# Patient Record
Sex: Female | Born: 1965 | Race: White | Hispanic: No | Marital: Married
Health system: Midwestern US, Community
[De-identification: ages and names within clinical notes are randomized; demographics above are authoritative.]

## PROBLEM LIST (undated history)

## (undated) DIAGNOSIS — Z0000 Encounter for general adult medical examination without abnormal findings: Secondary | ICD-10-CM

## (undated) DIAGNOSIS — R350 Frequency of micturition: Secondary | ICD-10-CM

## (undated) DIAGNOSIS — Z1231 Encounter for screening mammogram for malignant neoplasm of breast: Secondary | ICD-10-CM

## (undated) DIAGNOSIS — Z20822 Contact with and (suspected) exposure to covid-19: Secondary | ICD-10-CM

## (undated) DIAGNOSIS — Z1239 Encounter for other screening for malignant neoplasm of breast: Secondary | ICD-10-CM

## (undated) DIAGNOSIS — M1990 Unspecified osteoarthritis, unspecified site: Secondary | ICD-10-CM

## (undated) DIAGNOSIS — N390 Urinary tract infection, site not specified: Secondary | ICD-10-CM

## (undated) DIAGNOSIS — E785 Hyperlipidemia, unspecified: Secondary | ICD-10-CM

## (undated) DIAGNOSIS — R319 Hematuria, unspecified: Secondary | ICD-10-CM

## (undated) DIAGNOSIS — E7800 Pure hypercholesterolemia, unspecified: Secondary | ICD-10-CM

## (undated) DIAGNOSIS — E559 Vitamin D deficiency, unspecified: Secondary | ICD-10-CM

## (undated) DIAGNOSIS — E538 Deficiency of other specified B group vitamins: Secondary | ICD-10-CM

---

## 2013-02-24 NOTE — Patient Instructions (Signed)
Headache: After Your Visit  Your Care Instructions     Headaches have many possible causes. Most headaches aren't a sign of a more serious problem, and they will get better on their own. Home treatment may help you feel better faster.  The doctor has checked you carefully, but problems can develop later. If you notice any problems or new symptoms, get medical treatment right away.  Follow-up care is a key part of your treatment and safety. Be sure to make and go to all appointments, and call your doctor if you are having problems. It's also a good idea to know your test results and keep a list of the medicines you take.  How can you care for yourself at home?  ?? Do not drive if you have taken a prescription pain medicine.  ?? Rest in a quiet, dark room until your headache is gone. Close your eyes and try to relax or go to sleep. Don't watch TV or read.  ?? Put a cold, moist cloth or cold pack on the painful area for 10 to 20 minutes at a time. Put a thin cloth between the cold pack and your skin.  ?? Use a warm, moist towel or a heating pad set on low to relax tight shoulder and neck muscles.  ?? Have someone gently massage your neck and shoulders.  ?? Take pain medicines exactly as directed.  ?? If the doctor gave you a prescription medicine for pain, take it as prescribed.  ?? If you are not taking a prescription pain medicine, ask your doctor if you can take an over-the-counter medicine.  ?? Be careful not to take pain medicine more often than the instructions allow, because you may get worse or more frequent headaches when the medicine wears off.  ?? Do not ignore new symptoms that occur with a headache, such as a fever, weakness or numbness, vision changes, or confusion. These may be signs of a more serious problem.  To prevent headaches  ?? Keep a headache diary so you can figure out what triggers your headaches. Avoiding triggers may help you prevent headaches. Record when each headache began, how long it lasted, and  what the pain was like (throbbing, aching, stabbing, or dull). Write down any other symptoms you had with the headache, such as nausea, flashing lights or dark spots, or sensitivity to bright light or loud noise. Note if the headache occurred near your period. List anything that might have triggered the headache, such as certain foods (chocolate, cheese, wine) or odors, smoke, bright light, stress, or lack of sleep.  ?? Find healthy ways to deal with stress. Headaches are most common during or right after stressful times. Take time to relax before and after you do something that has caused a headache in the past.  ?? Try to keep your muscles relaxed by keeping good posture. Check your jaw, face, neck, and shoulder muscles for tension, and try relaxing them. When sitting at a desk, change positions often, and stretch for 30 seconds each hour.  ?? Get plenty of sleep and exercise.  ?? Eat regularly and well. Long periods without food can trigger a headache.  ?? Treat yourself to a massage. Some people find that regular massages are very helpful in relieving tension.  ?? Limit caffeine by not drinking too much coffee, tea, or soda. But don't quit caffeine suddenly, because that can also give you headaches.  ?? Reduce eyestrain from computers by blinking frequently and looking away   from the computer screen every so often. Make sure you have proper eyewear and that your monitor is set up properly, about an arm's length away.  ?? Seek help if you have depression or anxiety. Your headaches may be linked to these conditions. Treatment can both prevent headaches and help with symptoms of anxiety or depression.  When should you call for help?  Call 911 anytime you think you may need emergency care. For example, call if:  ?? You have signs of a stroke. These may include:  ?? Sudden numbness, paralysis, or weakness in your face, arm, or leg, especially on only one side of your body.  ?? Sudden vision changes.  ?? Sudden trouble speaking.   ?? Sudden confusion or trouble understanding simple statements.  ?? Sudden problems with walking or balance.  ?? A sudden, severe headache that is different from past headaches.  Call your doctor now or seek immediate medical care if:  ?? You have a new or worse headache.  ?? Your headache gets much worse.   Where can you learn more?   Go to http://www.healthwise.net/[facility]  Enter M271 in the search box to learn more about "Headache: After Your Visit."   ?? 2006-2014 Healthwise, Incorporated. Care instructions adapted under license by [employer] (which disclaims liability or warranty for this information). This care instruction is for use with your licensed healthcare professional. If you have questions about a medical condition or this instruction, always ask your healthcare professional. Healthwise, Incorporated disclaims any warranty or liability for your use of this information.  Content Version: 10.2.346038; Current as of: June 23, 2012

## 2013-02-24 NOTE — Progress Notes (Signed)
Internal Medicine Progress Note    NAME:  Lindsay Liu   DOB:   08/05/1965   MRN:   [id number]   AGE:             47 y.o.  SEX:   female    Date/Time:  02/24/2013  Subjective:     Chief Complaint: 47 y.o. female  who comes for  Migraine        HPI:  Migraine   The history is provided by the patient. This is a new (left occipital headache) problem. Episode onset: 4 days ago Sunday night. The problem occurs constantly. The problem has not changed since onset.The headache is aggravated by nothing. The pain is located in the occipital region. The quality of the pain is described as throbbing. The pain is at a severity of 8/10. The pain is severe. Associated symptoms include malaise/fatigue (wakes her up in the middle of the night) and dizziness (feeling lightheaded). Pertinent negatives include no fever, no chest pressure, no near-syncope, no orthopnea, no palpitations, no syncope, no shortness of breath, no weakness, no tingling, no visual change, no nausea and no vomiting. Treatments tried: tried Aleve and Advil. Advil works better. The treatment provided moderate relief.     Advil brings down her headache from 7-8/10 to 2-3/10.     Patient stated that she's feeling stress (mom has cancer) - on Sunday they were shopping around for a wig for her mom and her headache started Sunday night. Patient denies previous history of headaches.    Other concerns:    1- A very itchy rash appeared on right side of her face anterior to right ear. Denies pain. However, she c/o shooting pain on the right occipital. This is different from the left occipital headache that wakes her up in the middle of the night. She's concerned about shingles and being around her mom.  2- Needs requisition for bone density and screening mammography  3- flu vaccine      Review of Systems:  Review of Systems   Constitutional: Positive for malaise/fatigue (wakes her up in the middle of the night). Negative for fever.   HENT: Positive for congestion (nasal).  Negative for ear pain and sore throat.    Eyes: Negative.    Respiratory: Negative.  Negative for shortness of breath.    Cardiovascular: Negative.  Negative for palpitations, orthopnea, syncope and near-syncope.   Gastrointestinal: Negative.  Negative for nausea and vomiting.   Genitourinary: Negative.    Musculoskeletal: Negative.    Skin: Positive for itching and rash (right side of face).   Neurological: Positive for dizziness (feeling lightheaded) and headaches. Negative for tingling, sensory change, speech change, focal weakness and weakness.   Psychiatric/Behavioral:        Mom has myeloma and patient feeling stress           with a past history as below    PMH:  Past Medical History   Diagnosis Date   ??? Hypercholesterolemia    ??? Thyroid disease    ??? Chronic urticaria    ??? Endometriosis        PSH:  Past Surgical History   Procedure Laterality Date   ??? Hx appendectomy     ??? Hx laparotomy     ??? Hx cholecystectomy             FAMILY HX:  Family History   Problem Relation Age of Onset   ??? Elevated Lipids Mother    ???  Heart Disease Mother    ??? Heart Disease Father    ??? Cancer Mother 57     multiple myeloma       SOCIAL HX:  History     Social History   ??? Marital Status: MARRIED     Spouse Name: N/A     Number of Children: N/A   ??? Years of Education: N/A     Social History Main Topics   ??? Smoking status: Former Smoker -- 0.25 packs/day for 20 years     Types: Cigarettes     Quit date: 07/15/2005   ??? Smokeless tobacco: Not on file   ??? Alcohol Use: Yes      Comment: social   ??? Drug Use: No   ??? Sexually Active: Not on file     Other Topics Concern   ??? Not on file     Social History Narrative   ??? No narrative on file         IMMUNIZATIONS:    There is no immunization history on file for this patient.     [x] Current Medication list and allergies reviewed:  Allergies   Allergen Reactions   ??? Levaquin [Levofloxacin] Shortness of Breath   ??? Penicillin G Shortness of Breath       Medications currently taking:    No current  facility-administered medications for this visit.         Objective:   Vitals:  Filed Vitals:    02/24/13 0938   BP: 120/90   Pulse: 85   Temp: 98.1 ??F (36.7 ??C)   TempSrc: Oral   Resp: 16   Height: 5\' 3"  (1.6 m)   Weight: 156 lb (70.761 kg)   SpO2: 96%     Body mass index is 27.64 kg/(m^2).      PHYSICAL EXAM:  Physical Exam   Constitutional: She is oriented to person, place, and time. She appears well-developed and well-nourished. No distress.   HENT:   Head: Normocephalic.       Right Ear: Hearing, tympanic membrane, external ear and ear canal normal.   Left Ear: Hearing, tympanic membrane, external ear and ear canal normal.   Mouth/Throat: Uvula is midline, oropharynx is clear and moist and mucous membranes are normal.   Neck: Normal range of motion.   Cardiovascular: Normal rate, regular rhythm, normal heart sounds and intact distal pulses.    Pulmonary/Chest: Effort normal and breath sounds normal.   Abdominal: Soft. There is no tenderness.   Neurological: She is alert and oriented to person, place, and time. She has normal strength. No cranial nerve deficit or sensory deficit.   Skin: Skin is warm and dry.               Assessment/Plan:     Sanae was seen today for migraine.    Impression and associated orders for this visit:    Headache  - Trial of meloxicam (MOBIC) 15 mg tablet; Take 1 tablet by mouth daily  -  Go to ED if headache worsens and becomes "worst headache of her life" and/or new symptoms develop.    Rash of face  - Start valACYclovir (VALTREX) 1 gram tablet; Take 1 tablet by mouth three (3) times daily for 7 days.    Return to office if symptoms worsening or new symptoms develop    Other Orders  Breast cancer screening  - MAM MAMMOGRAM SCREENING BILATERAL; Future    Screening for osteoporosis  - DEXA BONE DENSITY  STUDY AXIAL; Future     Flu vaccine given.      This case was presented to Dr. Nile Riggs, MD.  .      No results found for any visits on 02/24/13.  Follow-up Disposition: Not on File     Total time spent with patient:  15   minutes    Care Plan discussed with:    [x] Patient   [] Family    [] Care Manager    [] Nursing   [] Consultant/Specialist :      []   >50% of visit spent in counseling and coordination of care   (Discussed [] CODE status,  [] Care Plan, [] D/C Planning)      I have reviewed patient's allergies, past med/surgical/family hx, smoking status, problem lists, and have completed medication reconciliation for today's visit.       Eryka Dolinger Reyes-Pastorella, NP      02/24/2013  ___________________________________________________

## 2013-02-24 NOTE — Progress Notes (Signed)
I reviewed the patient's medical history, the nurse practitioner's findings on physical examination, the patient's diagnoses, and treatment plan as documented in the progress note. I concur with the treatment plan as documented. There are no additional recommendations at this time.    Temitayo Covalt, MD

## 2013-05-06 ENCOUNTER — Encounter

## 2013-05-06 NOTE — Progress Notes (Signed)
Quick Note:    Team 3- This is the first mammogram result. Pls tell patient she needs mammo every year. Thx.  ______

## 2013-05-06 NOTE — Progress Notes (Signed)
Quick Note:    Team 3 - Pls call patient to let her know that the additional mammogram of her left breast is normal but she needs to do it every year.  ______

## 2013-05-06 NOTE — Progress Notes (Signed)
Quick Note:    Lm to c/b  ______

## 2013-05-31 NOTE — Progress Notes (Signed)
Quick Note:    L/m to c.b  ______

## 2013-05-31 NOTE — Progress Notes (Signed)
Quick Note:    Pt advised  ______

## 2013-12-30 LAB — AMB POC URINE DIP STICK MANUAL W/O MICRO CHEMSTRIP-10
Bilirubin (UA POC): NEGATIVE
Glucose (UA POC): NEGATIVE mg/dL
Ketones (UA POC): NEGATIVE
Nitrites (UA POC): POSITIVE
Specific gravity (UA POC): 1.015 (ref 1–1.03)
Urobilinogen (UA POC): NORMAL
pH (UA POC): 5 (ref 5–9)

## 2013-12-30 MED ORDER — TRIMETHOPRIM-SULFAMETHOXAZOLE 160 MG-800 MG TAB
160-800 mg | ORAL_TABLET | Freq: Two times a day (BID) | ORAL | Status: DC
Start: 2013-12-30 — End: 2014-01-04

## 2013-12-30 MED ORDER — PHENAZOPYRIDINE 200 MG TAB
200 mg | ORAL_TABLET | Freq: Three times a day (TID) | ORAL | Status: AC | PRN
Start: 2013-12-30 — End: 2014-01-02

## 2013-12-30 NOTE — Patient Instructions (Signed)
Urinary Tract Infection in Women: After Your Visit  Your Care Instructions     A urinary tract infection, or UTI, is a general term for an infection anywhere between the kidneys and the urethra (where urine comes out). Most UTIs are bladder infections. They often cause pain or burning when you urinate.  UTIs are caused by bacteria and can be cured with antibiotics. Be sure to complete your treatment so that the infection goes away.  Follow-up care is a key part of your treatment and safety. Be sure to make and go to all appointments, and call your doctor if you are having problems. It's also a good idea to know your test results and keep a list of the medicines you take.  How can you care for yourself at home?  ?? Take your antibiotics as directed. Do not stop taking them just because you feel better. You need to take the full course of antibiotics.  ?? Drink extra water and other fluids for the next day or two. This may help wash out the bacteria that are causing the infection. (If you have kidney, heart, or liver disease and have to limit fluids, talk with your doctor before you increase your fluid intake.)  ?? Avoid drinks that are carbonated or have caffeine. They can irritate the bladder.  ?? Urinate often. Try to empty your bladder each time.  ?? To relieve pain, take a hot bath or lay a heating pad set on low over your lower belly or genital area. Never go to sleep with a heating pad in place.  To prevent UTIs  ?? Drink plenty of water each day. This helps you urinate often, which clears bacteria from your system. (If you have kidney, heart, or liver disease and have to limit fluids, talk with your doctor before you increase your fluid intake.)  ?? Consider adding cranberry juice to your diet.  ?? Urinate when you need to.  ?? Urinate right after you have sex.  ?? Change sanitary pads often.  ?? Avoid douches, bubble baths, feminine hygiene sprays, and other feminine hygiene products that have deodorants.   ?? After going to the bathroom, wipe from front to back.  When should you call for help?  Call your doctor now or seek immediate medical care if:  ?? Symptoms such as fever, chills, nausea, or vomiting get worse or appear for the first time.  ?? You have new pain in your back just below your rib cage. This is called flank pain.  ?? There is new blood or pus in your urine.  ?? You have any problems with your antibiotic medicine.  Watch closely for changes in your health, and be sure to contact your doctor if:  ?? You are not getting better after taking an antibiotic for 2 days.  ?? Your symptoms go away but then come back.   Where can you learn more?   Go to http://www.healthwise.net/[facility]  Enter K848 in the search box to learn more about "Urinary Tract Infection in Women: After Your Visit."   ?? 2006-2015 Healthwise, Incorporated. Care instructions adapted under license by [employer] (which disclaims liability or warranty for this information). This care instruction is for use with your licensed healthcare professional. If you have questions about a medical condition or this instruction, always ask your healthcare professional. Healthwise, Incorporated disclaims any warranty or liability for your use of this information.  Content Version: 10.5.422740; Current as of: December 21, 2012

## 2013-12-30 NOTE — Progress Notes (Signed)
Chief Complaint:   Urinary Frequency and Suprapubic Pressure      HPI:   Lindsay Liu is a 48 y.o. female with a past medical history as below who comes for c/o urinary pressure, frequency and urgency past few days, + b/l flank discomfort, cranberry pills without effect, denies fever, chills   denies abdominal/ pelvic pain or unusual vaginal discharge/ bleeding. Patient does not have a history of recurrent UTI or pyelonephritis.      Review of Systems : Positive as above      Allergies   Allergen Reactions   ??? Levaquin [Levofloxacin] Shortness of Breath   ??? Penicillin G Shortness of Breath       Past Medical History   Diagnosis Date   ??? Hypercholesterolemia    ??? Thyroid disease    ??? Chronic urticaria    ??? Endometriosis      Past Surgical History   Procedure Laterality Date   ??? Hx appendectomy     ??? Hx laparotomy     ??? Hx cholecystectomy       Family History   Problem Relation Age of Onset   ??? Elevated Lipids Mother    ??? Heart Disease Mother    ??? Heart Disease Father    ??? Cancer Mother 32     multiple myeloma     Current Outpatient Prescriptions   Medication Sig Dispense Refill   ??? meloxicam (MOBIC) 15 mg tablet Take 1 tablet by mouth daily. 30 tablet 0         Pulse 93   Temp(Src) 98.7 ??F (37.1 ??C)   Ht '5\' 3"'  (1.6 m)   Wt 164 lb (74.39 kg)   BMI 29.06 kg/m2   SpO2 98%      PHYSICAL EXAM:  General appearance: WDWN , appears stated age, NAD  Eyes: sclera and conjunctiva norm  Abdomen: +BS, soft, non-tender, negative CVA tenderness  Extremities: no edema  Skin: warm and dry  MS:  equal strength bilaterally  GU: not examined  Neurologic: alert and oriented, good insight    Results for orders placed or performed in visit on 12/30/13   AMB POC URINE DIP STICK MANUAL W/O MICRO CHEMSTRIP-10   Result Value Ref Range    Color (UA POC) Yellow     Clarity (UA POC) Clear     Specific gravity (UA POC) 1.015 1 - 1.03    pH (UA POC) 5.0 5 - 9    Leukocyte esterase (UA POC) 3+ Negative    Nitrites (UA POC) Positive Negative     Protein (UA POC) Trace Negative mg/dL    Glucose (UA POC) Negative Negative mg/dL    Ketones (UA POC) Negative Negative    Urobilinogen (UA POC) normal Neg    Bilirubin (UA POC) Negative Negative    Blood (UA POC) about 50 Negative         Assessment/Plan:    1. Urinary frequency    - AMB POC URINE DIP STICK MANUAL W/O MICRO CHEMSTRIP-10  - CULTURE, URINE  - URINALYSIS W/MICROSCOPIC    2. UTI (lower urinary tract infection)  Increase fluids  F/u call Monday review C&S results  - trimethoprim-sulfamethoxazole (BACTRIM DS, SEPTRA DS) 160-800 mg per tablet; Take 1 Tab by mouth two (2) times a day.  Dispense: 10 Tab; Refill: 0  - phenazopyridine (PYRIDIUM) 200 mg tablet; Take 1 Tab by mouth three (3) times daily as needed for Pain for up to 3 days.  Dispense: 6  Tab; Refill: 0          Care Plan discussed with:   Patient   [place]   Consultant/Specialist :     >50% of visit spent in counseling and coordination of care Discussed Advanced     Directives,   smoking cessation    diet   excercise   weight loss recommended  seat belts  sun screen usage  safe sex practices        Felipa Emory, NP

## 2013-12-31 LAB — URINALYSIS W/MICROSCOPIC
Bilirubin: NEGATIVE
Crystals, urine: NONE SEEN /HPF
Epithelial Cells: NONE SEEN /HPF
Glucose: NEGATIVE mg/dL
Ketone: NEGATIVE mg/dL
Nitrites: NEGATIVE
Protein: NEGATIVE mg/dL
Specific gravity: 1.019 R.I. (ref 1.005–1.030)
Urobilinogen: 0.2 mg/dL (ref 0.0–1.0)
Yeast, urine: NONE SEEN /HPF
pH (UA): 5 (ref 5.0–8.0)

## 2014-01-02 LAB — ANTIMICROBIAL SUSCEPTIBILITY

## 2014-01-02 LAB — CULTURE, URINE

## 2014-01-02 NOTE — Telephone Encounter (Signed)
Results reviewed, sx have improved, feeling better

## 2014-01-02 NOTE — Progress Notes (Signed)
I reviewed the patient's medical history, the nurse practitioner's findings on physical examination, the patient's diagnoses, and treatment plan as documented in the progress note. I concur with the treatment plan as documented. There are no additional recommendations at this time.    Sommer Spickard, MD

## 2014-01-04 ENCOUNTER — Encounter

## 2014-01-04 MED ORDER — TRIMETHOPRIM-SULFAMETHOXAZOLE 160 MG-800 MG TAB
160-800 mg | ORAL_TABLET | Freq: Two times a day (BID) | ORAL | Status: DC
Start: 2014-01-04 — End: 2014-08-12

## 2014-01-04 NOTE — Telephone Encounter (Signed)
Patient evaluated Friday (dx) UTI  Prescribed  bactrim for 5-days - pt states she completed the course of medication - but is still feeling pressure / pressure feeling only little better  [phone]

## 2014-01-05 NOTE — Telephone Encounter (Signed)
Called advised patient  Lindsay Liu

## 2014-04-14 LAB — HM DEXA SCAN

## 2014-06-02 NOTE — Progress Notes (Signed)
Quick Note:        Bone density is normal 10. & Stable    ______

## 2014-06-05 NOTE — Progress Notes (Signed)
Quick Note:        lvm on cell # for pt to call me back with results        ______

## 2014-06-05 NOTE — Progress Notes (Signed)
Quick Note:        S/w pt reviewed normal Dexa and Normal Mammo results. Instructed to f/u Dexa in 2 yrs and F/u Mammo in 1 yr.    Discussed with pt regular calcium & vitamin d intake, calcium rich foods, and weight bearing exercises like swimming, walking, dancing.    ______

## 2014-08-12 ENCOUNTER — Ambulatory Visit
Admit: 2014-08-12 | Discharge: 2014-08-12 | Payer: PRIVATE HEALTH INSURANCE | Attending: Acute Care | Primary: Internal Medicine

## 2014-08-12 DIAGNOSIS — N3090 Cystitis, unspecified without hematuria: Secondary | ICD-10-CM

## 2014-08-12 LAB — AMB POC URINE DIP STICK MANUAL W/O MICRO CHEMSTRIP-10
Bilirubin (UA POC): NEGATIVE
Glucose (UA POC): NEGATIVE mg/dL
Ketones (UA POC): NEGATIVE
Leukocyte esterase (UA POC): NEGATIVE
Nitrites (UA POC): NEGATIVE
Protein (UA POC): NEGATIVE mg/dL
Specific gravity (UA POC): 1.015 (ref 1–1.03)
Urobilinogen (UA POC): NORMAL
pH (UA POC): 7 (ref 5–9)

## 2014-08-12 MED ORDER — NITROFURANTOIN (25% MACROCRYSTAL FORM) 100 MG CAP
100 mg | ORAL_CAPSULE | Freq: Two times a day (BID) | ORAL | Status: DC
Start: 2014-08-12 — End: 2014-09-15

## 2014-08-12 NOTE — Progress Notes (Signed)
Chief Complaint:  Lindsay Liu is a 49 y.o.female here for Bladder Infection      HPI:  Supra pubic pressure started Thursday, mild dysuria, denies vag d/c, no hematuria, +frequency, denies flank/back pain - new to these sx    Review of Systems :         Allergies   Allergen Reactions   ??? Levaquin [Levofloxacin] Shortness of Breath     Cipro in the past without adverse effect   ??? Penicillin G Shortness of Breath       Outpatient Prescriptions Marked as Taking for the 08/12/14 encounter (Office Visit) with Hoy Register, NP   Medication Sig Dispense Refill   ??? meloxicam (MOBIC) 15 mg tablet Take 15 mg by mouth daily.     ??? nitrofurantoin, macrocrystal-monohydrate, (MACROBID) 100 mg capsule Take 1 Cap by mouth two (2) times a day. Indications: BACTERIAL URINARY TRACT INFECTION 14 Cap 7       Past Medical History   Diagnosis Date   ??? Hypercholesterolemia    ??? Thyroid disease    ??? Chronic urticaria    ??? Endometriosis      Past Surgical History   Procedure Laterality Date   ??? Hx appendectomy     ??? Hx laparotomy     ??? Hx cholecystectomy       Family History   Problem Relation Age of Onset   ??? Elevated Lipids Mother    ??? Heart Disease Mother    ??? Heart Disease Father    ??? Cancer Mother 83     multiple myeloma     Family Status   Relation Status Death Age   ??? Mother Alive    ??? Father Deceased 38     MI   ??? Daughter Alive    ??? Son Alive      History     Social History   ??? Marital Status: MARRIED     Spouse Name: N/A   ??? Number of Children: N/A   ??? Years of Education: N/A     Occupational History   ??? Not on file.     Social History Main Topics   ??? Smoking status: Former Smoker -- 0.25 packs/day for 20 years     Types: Cigarettes     Quit date: 07/15/2005   ??? Smokeless tobacco: Not on file   ??? Alcohol Use: Yes      Comment: social   ??? Drug Use: No   ??? Sexual Activity: Not on file     Other Topics Concern   ??? Not on file     Social History Narrative     Immunization History   Administered Date(s) Administered    ??? Influenza Vaccine PF 02/24/2013         BP 105/77 mmHg   Pulse 84   Temp(Src) 98.4 ??F (36.9 ??C) (Oral)   Resp 16   Ht _0  (1.6 m)   Wt 168 lb (76.204 kg)   BMI 29.77 kg/m2   SpO2 98%    Physical Exam:  General appearance: WDWN , appears stated age, NAD  Skin: warm, dry, no rash  Cardiovascular: RRR, no murmur  Respiratory: clear to auscultation bilaterally     Abdomen: +BS, no bruits, soft, non-tender, no CVAT  Extremities: no edema  Neurologic: alert and oriented, cranial nerves grossly intact, good recall    Results for orders placed or performed in visit on 08/12/14   AMB POC URINE DIP STICK  MANUAL W/O MICRO CHEMSTRIP-10   Result Value Ref Range    Color (UA POC) Light Yellow     Clarity (UA POC) Clear     Specific gravity (UA POC) 1.015 1 - 1.03    pH (UA POC) 7 5 - 9    Leukocyte esterase (UA POC) Negative Negative    Nitrites (UA POC) Negative Negative    Protein (UA POC) Negative Negative mg/dL    Glucose (UA POC) Negative Negative mg/dL    Ketones (UA POC) Negative Negative    Urobilinogen (UA POC) normal Neg    Bilirubin (UA POC) Negative Negative    Blood (UA POC) Trace Negative     Office Visit on 08/12/2014   Component Date Value   ??? Color (UA POC) 08/12/2014 Light Yellow    ??? Clarity (UA POC) 08/12/2014 Clear    ??? Specific gravity (UA POC) 08/12/2014 1.015    ??? pH (UA POC) 08/12/2014 7    ??? Leukocyte esterase (UA P* 08/12/2014 Negative    ??? Nitrites (UA POC) 08/12/2014 Negative    ??? Protein (UA POC) 08/12/2014 Negative    ??? Glucose (UA POC) 08/12/2014 Negative    ??? Ketones (UA POC) 08/12/2014 Negative    ??? Urobilinogen (UA POC) 08/12/2014 normal    ??? Bilirubin (UA POC) 08/12/2014 Negative    ??? Blood (UA POC) 08/12/2014 Trace        Assessment/Plan:  Lindsay Liu was seen today for bladder infection.    Diagnoses and all orders for this visit:    Bladder infection  Orders:  -     AMB POC URINE DIP STICK MANUAL W/O MICRO CHEMSTRIP-10    Other orders   -     nitrofurantoin, macrocrystal-monohydrate, (MACROBID) 100 mg capsule; Take 1 Cap by mouth two (2) times a day. Indications: BACTERIAL URINARY TRACT INFECTION          Follow up:   Follow-up Disposition:  Return in about 1 month (around 09/11/2014) for CPE.    Care Plan discussed with:   Patient   Family    >50% of visit spent in counseling and coordination of care    Discussed Advanced Directives,   smoking cessation    diet   exercise      weight loss recommended  seat belts  medication compliance/usage/side effect  time spent 25 mins    I have reviewed patient's allergies, past med/surgical/family hx, smoking status, problem lists, and have completed medication reconciliation for today's visit.    Hoy Register, NP  08/12/2014

## 2014-08-12 NOTE — Progress Notes (Signed)
I reviewed the patient's medical history, the nurse practitioner's findings on physical examination, the patient's diagnoses, and treatment plan as documented in the progress note. I concur with the treatment plan as documented. There are no additional recommendations at this time.    Christpoher Sievers A Keanan Melander, MD

## 2014-08-12 NOTE — Patient Instructions (Signed)
Urinary Tract Infection in Women: Care Instructions  Your Care Instructions     A urinary tract infection, or UTI, is a general term for an infection anywhere between the kidneys and the urethra (where urine comes out). Most UTIs are bladder infections. They often cause pain or burning when you urinate.  UTIs are caused by bacteria and can be cured with antibiotics. Be sure to complete your treatment so that the infection goes away.  Follow-up care is a key part of your treatment and safety. Be sure to make and go to all appointments, and call your doctor if you are having problems. It's also a good idea to know your test results and keep a list of the medicines you take.  How can you care for yourself at home?  ?? Take your antibiotics as directed. Do not stop taking them just because you feel better. You need to take the full course of antibiotics.  ?? Drink extra water and other fluids for the next day or two. This may help wash out the bacteria that are causing the infection. (If you have kidney, heart, or liver disease and have to limit fluids, talk with your doctor before you increase your fluid intake.)  ?? Avoid drinks that are carbonated or have caffeine. They can irritate the bladder.  ?? Urinate often. Try to empty your bladder each time.  ?? To relieve pain, take a hot bath or lay a heating pad set on low over your lower belly or genital area. Never go to sleep with a heating pad in place.  To prevent UTIs  ?? Drink plenty of water each day. This helps you urinate often, which clears bacteria from your system. (If you have kidney, heart, or liver disease and have to limit fluids, talk with your doctor before you increase your fluid intake.)  ?? Consider adding cranberry juice to your diet.  ?? Urinate when you need to.  ?? Urinate right after you have sex.  ?? Change sanitary pads often.  ?? Avoid douches, bubble baths, feminine hygiene sprays, and other feminine hygiene products that have deodorants.   ?? After going to the bathroom, wipe from front to back.  When should you call for help?  Call your doctor now or seek immediate medical care if:  ?? Symptoms such as fever, chills, nausea, or vomiting get worse or appear for the first time.  ?? You have new pain in your back just below your rib cage. This is called flank pain.  ?? There is new blood or pus in your urine.  ?? You have any problems with your antibiotic medicine.  Watch closely for changes in your health, and be sure to contact your doctor if:  ?? You are not getting better after taking an antibiotic for 2 days.  ?? Your symptoms go away but then come back.   Where can you learn more?   Go to http://www.healthwise.net/[facility]  Enter K848 in the search box to learn more about "Urinary Tract Infection in Women: Care Instructions."   ?? 2006-2016 [employer]. Care instructions adapted under license by [facility] (which disclaims liability or warranty for this information). This care instruction is for use with your licensed healthcare professional. If you have questions about a medical condition or this instruction, always ask your healthcare professional. Healthwise, Incorporated disclaims any warranty or liability for your use of this information.  Content Version: 10.8.513193; Current as of: March 03, 2014

## 2014-08-14 LAB — URINALYSIS W/MICROSCOPIC
Bilirubin: NEGATIVE
Crystals, urine: NONE SEEN /HPF
Epithelial Cells: NONE SEEN /HPF
Glucose: NEGATIVE mg/dL
Ketone: NEGATIVE mg/dL
Leukocyte Esterase: NEGATIVE
Nitrites: POSITIVE — ABNORMAL HIGH
Protein: NEGATIVE mg/dL
Specific gravity: 1.004 R.I. — ABNORMAL LOW (ref 1.005–1.030)
Urobilinogen: 0.2 mg/dL (ref 0.0–1.0)
WBC: NONE SEEN /HPF (ref 5–5)
Yeast, urine: NONE SEEN /HPF
pH (UA): 6.5 (ref 5.0–8.0)

## 2014-08-16 LAB — ANTIMICROBIAL SUSCEPTIBILITY

## 2014-08-16 LAB — CULTURE, URINE

## 2014-08-17 NOTE — Progress Notes (Signed)
Quick Note:        + UTI - antibiotic (macrobid) good coverage - advise pt to complete    ______

## 2014-08-18 ENCOUNTER — Encounter

## 2014-08-18 NOTE — Progress Notes (Signed)
Quick Note:        LMOM asking pt to call our office back for lab results    ______

## 2014-08-21 NOTE — Progress Notes (Signed)
Quick Note:        S/w pt review good coverage onmacrobid; voiding on urge and regular water intake    ______

## 2014-09-07 ENCOUNTER — Telehealth

## 2014-09-07 MED ORDER — ERGOCALCIFEROL (VITAMIN D2) 50,000 UNIT CAP
1250 mcg (50,000 unit) | ORAL_CAPSULE | ORAL | Status: DC
Start: 2014-09-07 — End: 2015-09-20

## 2014-09-07 NOTE — Progress Notes (Signed)
Quick Note:        Discussed with pt. Supplement B12 1000 mcg daily. Supplement Vit D as per protocol for 12 weeks followed by maintenance dose.    Elevated cholesterol, will discuss on 6/3    ______

## 2014-09-07 NOTE — Telephone Encounter (Signed)
Vit D = 12  B 12 = 265    Supplement Vit D as per protocol  B12 1000 mcg daily, will get B12 inj on 6/3 when she comes in for physical

## 2014-09-15 ENCOUNTER — Ambulatory Visit
Admit: 2014-09-15 | Discharge: 2014-09-15 | Payer: PRIVATE HEALTH INSURANCE | Attending: Primary Care | Primary: Internal Medicine

## 2014-09-15 DIAGNOSIS — Z0000 Encounter for general adult medical examination without abnormal findings: Secondary | ICD-10-CM

## 2014-09-15 MED ORDER — ATORVASTATIN 10 MG TAB
10 mg | ORAL_TABLET | Freq: Every day | ORAL | Status: DC
Start: 2014-09-15 — End: 2015-09-20

## 2014-09-15 MED ORDER — CYANOCOBALAMIN 1,000 MCG/ML IJ SOLN
1000 mcg/mL | Freq: Once | INTRAMUSCULAR | Status: AC
Start: 2014-09-15 — End: 2014-09-15
  Administered 2014-09-15: 15:00:00 via INTRAMUSCULAR

## 2014-09-15 NOTE — Progress Notes (Signed)
Chief Complaint :  Lindsay Liu is a 49 y.o.female here for Complete Physical      Patient Care Team:  Domingo Mend, MD as PCP - General (Internal Medicine)  Kevan Ny, MD (Orthopedic Surgery)  Everardo Pacific, NP    HPI  :  Here for CPE, cannot lose weight. Does not exercise. Doesn't really follow healthy diet. Sees Dr. Candace Cruise for ongoing neck pain. Takes diclofenac.   H/O elevated lipids  Labs-done and reviewed with pt  Mammo-UTD  GYN-UTD  BMD-done 2015  Cscope-will schedule      Review of Systems :  General : neg for fever, chills, wt loss/gain, fatigue  HEENT: neg for blurred vision, double vision, ear pain, sore throat  Respiratory :  neg for cough, sob, doe, wheezing  Cardiovascular : neg for chest pain, palpitations, swelling  Gastrointestinal : neg for abd pain, n/v/d, change in bowel habits, blood in stool, reflux  Genito-Urinary : neg for dysuria, trouble voiding, or hematuria  MS: neg for joint / muscle pain, swelling  Neurological : neg for dizziness or headaches  Psych: neg for depression or anxiety  Skin:  neg rash, changing skin lesion      Allergies   Allergen Reactions   ??? Levaquin [Levofloxacin] Shortness of Breath     Cipro in the past without adverse effect   ??? Penicillin G Shortness of Breath     Outpatient Prescriptions Marked as Taking for the 09/15/14 encounter (Office Visit) with Annett Gula, NP   Medication Sig Dispense Refill   ??? cyanocobalamin 1,000 mcg tablet Take 1,000 mcg by mouth daily.     ??? diclofenac potassium (CATAFLAM) 50 mg tablet Take 50 mg by mouth two (2) times a day.     ??? ergocalciferol (ERGOCALCIFEROL) 50,000 unit capsule Take 1 Cap by mouth every seven (7) days. 12 Cap 3     Current Facility-Administered Medications for the 09/15/14 encounter (Office Visit) with Annett Gula, NP   Medication Dose Route Frequency Provider Last Rate Last Dose   ??? [COMPLETED] cyanocobalamin (VITAMIN B12) injection 1,000 mcg  1,000 mcg  IntraMUSCular ONCE Annett Gula, NP   1,000 mcg at 09/15/14 1100     Past Medical History   Diagnosis Date   ??? Hypercholesterolemia    ??? Thyroid disease    ??? Chronic urticaria    ??? Endometriosis    ??? Cervical spine pain      Oh     Past Surgical History   Procedure Laterality Date   ??? Hx appendectomy     ??? Hx laparotomy     ??? Hx cholecystectomy  01/1994     Family History   Problem Relation Age of Onset   ??? Elevated Lipids Mother    ??? Heart Disease Mother    ??? Heart Disease Father    ??? Cancer Mother 82     multiple myeloma   ??? Elevated Lipids Brother      Family Status   Relation Status Death Age   ??? Mother Alive    ??? Father Deceased 55     MI   ??? Daughter Alive    ??? Son Alive    ??? Sister Alive    ??? Brother Alive      History     Social History   ??? Marital Status: MARRIED     Spouse Name: N/A   ??? Number of Children: 2   ??? Years of Education: N/A  Occupational History   ???  Self Employed     Social History Main Topics   ??? Smoking status: Current Every Day Smoker -- 30 years     Types: Cigarettes   ??? Smokeless tobacco: Never Used      Comment: 5-7 cigarettes per day.   ??? Alcohol Use: 0.0 oz/week     0 Standard drinks or equivalent per week      Comment: social   ??? Drug Use: No   ??? Sexual Activity: Not on file     Other Topics Concern   ??? Not on file     Social History Narrative     Immunization History   Administered Date(s) Administered   ??? Influenza Vaccine PF 02/24/2013     LABS-> 09/01/2014 B12 265, Vit D 12, TC 231, LDL 162    BP 104/72 mmHg   Pulse 78   Temp(Src) 98.1 ??F (36.7 ??C) (Oral)   Ht 5' 2.5" (1.588 m)   Wt 170 lb (77.111 kg)   BMI 30.58 kg/m2   SpO2 98%    Physical Exam :  General appearance: WDWN , appears stated age, NAD  Eyes: EOMs intact, PERRL, peripheral vision screen wnl, sclera non-icteric and conjuctiva pink   ENT: oral moist, no lesions, posterior pharynx clear, TMs translucent  Neck: supple, thyroid smooth, no carotid bruits  Nodes: no adenopathy   Skin: warm and dry, no rashes or suspicious lesions  Cardiovascular: regular heart rate/rhythm, no murmurs, no JVD  Respiratory: clear to auscultation bilaterally     Abdomen: +BS, soft, non-tender, no organomegaly, no bruits  Breasts: no asymmetry, dimpling, masses or tenderness  Extremities: no edema, no calf tenderness, distal pulses palpable   MS:  equal strength bilaterally, no spinal tenderness, mild kyphosis  Neurologic: alert and oriented, cranial nerves grossly intact, cerebellar function intact, good insight    Results for orders placed or performed in visit on 09/15/14   HM DEXA SCAN   Result Value Ref Range    Amb DEXA, External MRI    AMB POC EKG ROUTINE W/ 12 LEADS, INTER & REP    Impression    SR @ 66, normal axis, normal intervals, no ectopy.  No acute ST/T wave changes.  No prior for comparison               Assessment/Plan :  1. Physical exam  - AMB POC EKG ROUTINE W/ 12 LEADS, INTER & REP    2. Vitamin D deficiency  On prescription dose at present, discussed continuing 2000 units daily   - VITAMIN D, 25 HYDROXY; Future    3. B12 deficiency  Started supplementing with oral. Will boost with inj today  - cyanocobalamin (VITAMIN B12) injection 1,000 mcg; 1 mL by IntraMUSCular route once.  - THERAPEUTIC INJECTION  - VITAMIN B12; Future    4. Pure hypercholesterolemia  Agrees to start statin.  - atorvastatin (LIPITOR) 10 mg tablet; Take 1 Tab by mouth daily.  Dispense: 30 Tab; Refill: 5  - LIPID PANEL; Future    5. Encounter for screening colonoscopy  - REFERRAL FOR COLONOSCOPY    6. Obesity (BMI 30.0-34.9)  - REFERRAL TO NUTRITION  Discussed the patient's above normal BMI with her.  I have recommended the following interventions: dietary management education, guidance, and counseling .  The BMI follow up plan is as follows: BMI is out of normal parameters and plan is as follows: I have counseled this patient on diet and exercise regimens  I have reviewed patient's allergies, past med/surgical/family hx, smoking status, problem lists, and have completed medication reconciliation for today's visit.    Follow up :   Follow-up Disposition:  Return in about 6 months (around 03/17/2015) for Follow up.    Care Plan discussed with :   Patient   Family    >50% of visit spent in counseling and coordination of care    Discussed Advanced Directives,   smoking cessation    diet   exercise      weight loss recommended  seat belts  medication compliance/usage/side effect  time spent 45 mins    I have reviewed patient's allergies, past med/surgical/family hx, smoking status, problem lists, and have completed medication reconciliation for today's visit.        Annett Gula, NP

## 2014-09-18 NOTE — Progress Notes (Signed)
I reviewed the patient's medical history, the nurse practitioner's findings on physical examination, the patient's diagnoses, and treatment plan as documented in the progress note. I concur with the treatment plan as documented. There are no additional recommendations at this time.    Charmayne Odell A Dinisha Cai, MD

## 2014-12-16 ENCOUNTER — Encounter

## 2014-12-29 ENCOUNTER — Encounter: Attending: Primary Care | Primary: Internal Medicine

## 2015-01-05 ENCOUNTER — Ambulatory Visit
Admit: 2015-01-05 | Discharge: 2015-01-05 | Payer: PRIVATE HEALTH INSURANCE | Attending: Primary Care | Primary: Internal Medicine

## 2015-01-05 DIAGNOSIS — E7800 Pure hypercholesterolemia, unspecified: Secondary | ICD-10-CM

## 2015-01-05 NOTE — Progress Notes (Signed)
I reviewed the patient's medical history, the nurse practitioner's findings on physical examination, the patient's diagnoses, and treatment plan as documented in the progress note. I concur with the treatment plan as documented. There are no additional recommendations at this time.    Vance Hochmuth C Mohamadou Maciver, MD

## 2015-01-05 NOTE — Patient Instructions (Addendum)
After completing prescription st Vit D take 2000 units daily  Cut Vit B12 in half  Cont efforts at healthy diet and weight loss  Trial off atorvastatin when weight gets to 150      Vaccine Information Statement     Tdap (Tetanus, Diphtheria, Pertussis) Vaccine: What You Need to Know    Many Vaccine Information Statements are available in Spanish and other languages. See PromoAge.com.br.  Hojas de Informaci??n Sobre Vacunas est??n disponibles en espa??ol y en muchos otros idiomas. Visite PurchaseFilters.at    1. Why get vaccinated?    Tetanus, diphtheria, and pertussis are very serious diseases. Tdap vaccine can protect [place] from these diseases.  And, Tdap vaccine given to pregnant women can protect newborn babies against pertussis.    TETANUS (Lockjaw) is rare in the [place] today. It causes painful muscle tightening and stiffness, usually all over the body.  ? It can lead to tightening of muscles in the head and neck so you can???t open your mouth, swallow, or sometimes even breathe. Tetanus kills about 1 out of 10 people who are infected even after receiving the best medical care.      DIPHTHERIA is also rare in the [place] today.  It can cause a thick coating to form in the back of the throat.  ? It can lead to breathing problems, heart failure, paralysis, and death.    PERTUSSIS (Whooping Cough) causes severe coughing spells, which can cause difficulty breathing, vomiting, and disturbed sleep.  ? It can also lead to weight loss, incontinence, and rib fractures. Up to 2 in 100 adolescents and 5 in 100 adults with pertussis are hospitalized or have complications, which could include pneumonia or death.     These diseases are caused by bacteria. Diphtheria and pertussis are spread from person to person through secretions from coughing or sneezing. Tetanus enters the body through cuts, scratches, or wounds.    Before vaccines, as many as 200,000 cases of diphtheria, 200,000 cases of  pertussis, and hundreds of cases of tetanus, were reported in the [place] each year. Since vaccination began, reports of cases for tetanus and diphtheria have dropped by about 99% and for pertussis by about 80%.    2. Tdap vaccine    Tdap vaccine can protect adolescents and adults from tetanus, diphtheria, and pertussis. One dose of Tdap is routinely given at age 73 or 69.  People who did not get Tdap at that age should get it as soon as possible.    Tdap is especially important for health care professionals and anyone having close contact with a baby younger than 12 months.      Pregnant women should get a dose of Tdap during every pregnancy, to protect the newborn from pertussis.  Infants are most at risk for severe, life-threatening complications from pertussis.    Another vaccine, called Td, protects against tetanus and diphtheria, but not pertussis. A Td booster should be given every 10 years. Tdap may be given as one of these boosters if you have never gotten Tdap before.  Tdap may also be given after a severe cut or burn to prevent tetanus infection.    Your doctor or the person giving you the vaccine can give you more information.    Tdap may safely be given at the same time as other vaccines.    3. Some people should not get this vaccine    ??? A person who has ever had a life-threatening allergic reaction  after a previous dose of any diphtheria, tetanus or pertussis containing vaccine, OR has a severe allergy to any part of this vaccine, should not get Tdap vaccine.  Tell the person giving the vaccine about any severe allergies.    ??? Anyone who had coma or long repeated seizures within 7 days after a childhood dose of DTP or DTaP, or a previous dose of Tdap, should not get Tdap, unless a cause other than the vaccine was found.  They can still get Td.    ??? Talk to your doctor if you:  - have seizures or another nervous system problem,   - had severe pain or swelling after any vaccine containing diphtheria, tetanus or pertussis,   - ever had a condition called Guillain Barr?? Syndrome (GBS),  - aren???t feeling well on the day the shot is scheduled.    4. Risks    With any medicine, including vaccines, there is a chance of side effects. These are usually mild and go away on their own. Serious reactions are also possible but are rare.     Most people who get Tdap vaccine do not have any problems with it.    Mild Problems following Tdap  (Did not interfere with activities)  ??? Pain where the shot was given (about 3 in 4 adolescents or 2 in 3 adults)  ??? Redness or swelling where the shot was given (about 1 person in 5)  ??? Mild fever of at least 100.4??F (up to about 1 in 25 adolescents or 1 in 100 adults)  ??? Headache (about 3 or 4 people in 10)  ??? Tiredness (about 1 person in 3 or 4)  ??? Nausea, vomiting, diarrhea, stomach ache (up to 1 in 4 adolescents or 1 in 10 adults)  ??? Chills,  sore joints (about 1 person in 10)  ??? Body aches (about 1 person in 3 or 4)   ??? Rash, swollen glands (uncommon)    Moderate Problems following Tdap  (Interfered with activities, but did not require medical attention)  ??? Pain where the shot was given (up to 1 in 5 or 6)   ??? Redness or swelling where the shot was given (up to about 1 in 16 adolescents or 1 in 12 adults)  ??? Fever over 102??F (about 1 in 100 adolescents or 1 in 250 adults)  ??? Headache (about 1 in 7 adolescents or 1 in 10 adults)  ??? Nausea, vomiting, diarrhea, stomach ache (up to 1 or 3 people in 100)  ??? Swelling of the entire arm where the shot was given (up to about 1 in 500).     Severe Problems following Tdap  (Unable to perform usual activities; required medical attention)  ??? Swelling, severe pain, bleeding, and redness in the arm where the shot was given (rare).    Problems that could happen after any vaccine:    ??? People sometimes faint after a medical procedure, including vaccination.  Sitting or lying down for about 15 minutes can help prevent fainting, and injuries caused by a fall. Tell your doctor if you feel dizzy, or have vision changes or ringing in the ears.    ??? Some people get severe pain in the shoulder and have difficulty moving the arm where a shot was given. This happens very rarely.    ??? Any medication can cause a severe allergic reaction. Such reactions from a vaccine are very rare, estimated at fewer than 1 in a million doses, and would  happen within a few minutes to a few hours after the vaccination.     As with any medicine, there is a very remote chance of a vaccine causing a serious injury or death.    The safety of vaccines is always being monitored. For more information, visit: http://floyd.org/    5. What if there is a serious problem?    What should I look for?  ??? Look for anything that concerns you, such as signs of a severe allergic reaction, very high fever, or unusual behavior.    ??? Signs of a severe allergic reaction can include hives, swelling of the face and throat, difficulty breathing, a fast heartbeat, dizziness, and weakness. These would usually start a few minutes to a few hours after the vaccination.    What should I do?  ??? If you think it is a severe allergic reaction or other emergency that can???t wait, call 9-1-1 or get the person to the nearest hospital. Otherwise, call your doctor.    ??? Afterward, the reaction should be reported to the Vaccine Adverse Event Reporting System (VAERS). Your doctor might file this report, or you can do it yourself through the VAERS web site at www.vaers.LAgents.no, or by calling 1-912-783-1970.    VAERS does not give medical advice.    6. The National Vaccine Injury Compensation Program    The [employer] Vaccine Injury Compensation Program (VICP) is a federal program that was created to compensate people who may have been injured by certain vaccines.    Persons who believe they may have been injured by a vaccine can learn  about the program and about filing a claim by calling 1-319-143-2990 or visiting the VICP website at SpiritualWord.at. There is a time limit to file a claim for compensation.     7. How can I learn more?    ??? Ask your doctor. He or she can give you the vaccine package insert or suggest other sources of information.  ??? Call your local or state health department.  ??? Contact the Centers for Disease Control and Prevention (CDC):  - Call 531-666-2243 (1-800-CDC-INFO) or  - Visit CDC???s website at PicCapture.uy      Vaccine Information Statement   Tdap Vaccine  (06/07/2013)  42 [place] ?? 132GM-01    Department of Health and Insurance risk surveyor for Disease Control and Prevention    Office Use Only    Vaccine Information Statement    Influenza (Flu) Vaccine (Inactivated or Recombinant): What you need to know    Many Vaccine Information Statements are available in Spanish and other languages. See PromoAge.com.br  Hojas de Informaci??n Sobre Vacunas est??n disponibles en Espa??ol y en muchos otros idiomas. Visite PromoAge.com.br    1. Why get vaccinated?    Influenza (???flu???) is a contagious disease that spreads around the [place] every year, usually between October and May.     Flu is caused by influenza viruses, and is spread mainly by coughing, sneezing, and close contact.     Anyone can get flu. Flu strikes suddenly and can last several days. Symptoms vary by age, but can include:  ??? fever/chills  ??? sore throat  ??? muscle aches  ??? fatigue  ??? cough  ??? headache   ??? runny or stuffy nose    Flu can also lead to pneumonia and blood infections, and cause diarrhea and seizures in children.  If you have a medical condition, such as heart or lung disease, flu can make it  worse.    Flu is more dangerous for some people. Infants and young children, people 71 years of age and older, pregnant women, and people with certain health conditions or a weakened immune system are at greatest risk.       Each year thousands of people in the [place] die from flu, and many more are hospitalized.     Flu vaccine can:  ??? keep you from getting flu,  ??? make flu less severe if you do get it, and  ??? keep you from spreading flu to your family and other people.     2. Inactivated and recombinant flu vaccines    A dose of flu vaccine is recommended every flu season. Children 6 months through 27 years of age may need two doses during the same flu season.  Everyone else needs only one dose each flu season.       Some inactivated flu vaccines contain a very small amount of a mercury-based preservative called thimerosal. Studies have not shown thimerosal in vaccines to be harmful, but flu vaccines that do not contain thimerosal are available.    There is no live flu virus in flu shots.  They cannot cause the flu.     There are many flu viruses, and they are always changing. Each year a new flu vaccine is made to protect against three or four viruses that are likely to cause disease in the upcoming flu season. But even when the vaccine doesn???t exactly match these viruses, it may still provide some protection    Flu vaccine cannot prevent:  ??? flu that is caused by a virus not covered by the vaccine, or  ??? illnesses that look like flu but are not.    It takes about 2 weeks for protection to develop after vaccination, and protection lasts through the flu season.     3. Some people should not get this vaccine    Tell the person who is giving you the vaccine:    ??? If you have any severe, life-threatening allergies.    If you ever had a life-threatening allergic reaction after a dose of flu vaccine, or have a severe allergy to any part of this vaccine, you may be advised not to get vaccinated.  Most, but not all, types of flu vaccine contain a small amount of egg protein.       ??? If you ever had Guillain-Barr?? Syndrome (also called GBS).   Some people with a history of GBS should not get this vaccine. This should  be discussed with your doctor.    ??? If you are not feeling well.    It is usually okay to get flu vaccine when you have a mild illness, but you might be asked to come back when you feel better.      4. Risks of a vaccine reaction    With any medicine, including vaccines, there is a chance of reactions. These are usually mild and go away on their own, but serious reactions are also possible.     Most people who get a flu shot do not have any problems with it.     Minor problems following a flu shot include:   ??? soreness, redness, or swelling where the shot was given    ??? hoarseness  ??? sore, red or itchy eyes  ??? cough  ??? fever  ??? aches  ??? headache  ??? itching  ??? fatigue  If these problems occur,  they usually begin soon after the shot and last 1 or 2 days.     More serious problems following a flu shot can include the following:    ??? There may be a small increased risk of Guillain-Barr?? Syndrome (GBS) after inactivated flu vaccine.  This risk has been estimated at 1 or 2 additional cases per million people vaccinated. This is much lower than the risk of severe complications from flu, which can be prevented by flu vaccine.      ??? Young children who get the flu shot along with pneumococcal vaccine (PCV13) and/or DTaP vaccine at the same time might be slightly more likely to have a seizure caused by fever. Ask your doctor for more information. Tell your doctor if a child who is getting flu vaccine has ever had a seizure.     Problems that could happen after any injected vaccine:     ??? People sometimes faint after a medical procedure, including vaccination. Sitting or lying down for about 15 minutes can help prevent fainting, and injuries caused by a fall. Tell your doctor if you feel dizzy, or have vision changes or ringing in the ears.    ??? Some people get severe pain in the shoulder and have difficulty moving the arm where a shot was given. This happens very rarely.     ??? Any medication can cause a severe allergic reaction. Such reactions from a vaccine are very rare, estimated at about 1 in a million doses, and would happen within a few minutes to a few hours after the vaccination.    As with any medicine, there is a very remote chance of a vaccine causing a serious injury or death.    The safety of vaccines is always being monitored. For more information, visit: http://floyd.org/    5. What if there is a serious reaction?    What should I look for?    ??? Look for anything that concerns you, such as signs of a severe allergic reaction, very high fever, or unusual behavior.    Signs of a severe allergic reaction can include hives, swelling of the face and throat, difficulty breathing, a fast heartbeat, dizziness, and weakness ??? usually within a few minutes to a few hours after the vaccination.    What should I do?    ??? If you think it is a severe allergic reaction or other emergency that can???t wait, call 9-1-1 and get the person to the nearest hospital. Otherwise, call your doctor.    ??? Reactions should be reported to the Vaccine Adverse Event Reporting System (VAERS). Your doctor should file this report, or you can do it yourself through  the VAERS web site at www.vaers.LAgents.no, or by calling 1-781-649-7327.    VAERS does not give medical advice.    6. The National Vaccine Injury Compensation Program    The [employer] Vaccine Injury Compensation Program (VICP) is a federal program that was created to compensate people who may have been injured by certain vaccines.    Persons who believe they may have been injured by a vaccine can learn about the program and about filing a claim by calling 1-(802)378-9290 or visiting the VICP website at SpiritualWord.at.  There is a time limit to file a claim for compensation.    7. How can I learn more?  ??? Ask your healthcare provider. He or she can give you the vaccine package insert or suggest other sources of information.   ??? Call your  local or state health department.  ??? Contact the Centers for Disease Control and Prevention (CDC):  - Call 1-800-232-4636 (1-800-CDC-INFO) or  - Visit CDC???s website at www.cdc.gov/flu    Vaccine Information Statement   Inactivated Influenza Vaccine   11/18/2013  42 [place]C. ?? 300aa-26    Department of Health and Human Services  Centers for Disease Control and Prevention    Office Use Only

## 2015-01-05 NOTE — Progress Notes (Signed)
Chief Complaint :  Lindsay Liu is a 49 y.o.female here for Labs (follow-up)      Patient Care Team:  Lindsay Mend, MD as PCP - General (Internal Medicine)  Lindsay Ny, MD (Orthopedic Surgery)  Lindsay Pacific, NP    HPI  :  Started atorvastatin 23 months ago. Taking 10 mg every other day.  Also taking b12 and Vit dsupplements  Started walking and doing yoga.  Lost 9 lbs since June.       Labs-done and reviewed with pt  Mammo-UTD  GYN-UTD  BMD-done 2015  Cscope-will schedule      Review of Systems :  General : neg for fever, chills, wt loss/gain, fatigue  HEENT: neg for blurred vision, double vision, ear pain, sore throat  Respiratory :  neg for cough, sob, doe, wheezing  Cardiovascular : neg for chest pain, palpitations, swelling  Gastrointestinal : neg for abd pain, n/v/d, change in bowel habits, blood in stool, reflux  Genito-Urinary : neg for dysuria, trouble voiding, or hematuria  MS: neg for joint / muscle pain, swelling  Neurological : neg for dizziness or headaches  Psych: neg for depression or anxiety  Skin:  neg rash, changing skin lesion      Allergies   Allergen Reactions   ??? Levaquin [Levofloxacin] Shortness of Breath     Cipro in the past without adverse effect   ??? Penicillin G Shortness of Breath     Outpatient Prescriptions Marked as Taking for the 01/05/15 encounter (Office Visit) with Lindsay Gula, NP   Medication Sig Dispense Refill   ??? cyanocobalamin 1,000 mcg tablet Take 1,000 mcg by mouth daily.     ??? atorvastatin (LIPITOR) 10 mg tablet Take 1 Tab by mouth daily. (Patient taking differently: Take 10 mg by mouth every other day.) 30 Tab 5   ??? ergocalciferol (ERGOCALCIFEROL) 50,000 unit capsule Take 1 Cap by mouth every seven (7) days. 12 Cap 3     Past Medical History   Diagnosis Date   ??? Cervical spine pain      Oh   ??? Chronic urticaria    ??? Endometriosis    ??? Hypercholesterolemia    ??? Thyroid disease      Past Surgical History   Procedure Laterality Date   ??? Hx appendectomy      ??? Hx laparotomy     ??? Hx cholecystectomy  01/1994     Family History   Problem Relation Age of Onset   ??? Elevated Lipids Mother    ??? Heart Disease Mother    ??? Heart Disease Father    ??? Cancer Mother 12     multiple myeloma   ??? Elevated Lipids Brother      Family Status   Relation Status   ??? Mother Alive   ??? Father Deceased at age 21    MI   ??? Daughter Alive   ??? Son Alive   ??? Sister Alive   ??? Brother Alive     Social History     Social History   ??? Marital status: MARRIED     Spouse name: N/A   ??? Number of children: 2   ??? Years of education: N/A     Occupational History   ???  Self Employed     Social History Main Topics   ??? Smoking status: Current Every Day Smoker     Years: 30.00     Types: Cigarettes   ??? Smokeless tobacco: Never Used  Comment: 5-7 cigarettes per day.   ??? Alcohol use 0.0 oz/week     0 Standard drinks or equivalent per week      Comment: social   ??? Drug use: No   ??? Sexual activity: Not on file     Other Topics Concern   ??? Not on file     Social History Narrative     Immunization History   Administered Date(s) Administered   ??? Influenza Vaccine (Quad) 01/05/2015   ??? Influenza Vaccine PF 02/24/2013   ??? Tdap 01/05/2015     LABS-> 09/01/2014 B12 265, Vit D 12, TC 231, LDL 162  12/29/2014                B12 1255, Vit D 48, TC 149, LDL 83    Visit Vitals   ??? BP 99/74 (BP 1 Location: Right arm, BP Patient Position: Sitting)   ??? Pulse 93   ??? Temp 98.1 ??F (36.7 ??C) (Oral)   ??? Ht 5' 2.5" (1.588 m)   ??? Wt 161 lb (73 kg)   ??? SpO2 98%   ??? BMI 28.98 kg/m2       Physical Exam :  General appearance: WDWN , appears stated age, NAD  Eyes:  sclera non-icteric and conjuctiva pink   ENT: oral moist  Neck: supple  Nodes: no adenopathy  Skin: warm and dry  Cardiovascular: regular heart rate/rhythm, no murmurs, no JVD  Respiratory: clear to auscultation bilaterally     Extremities: no edema, no calf tenderness, distal pulses palpable   MS:  equal strength bilaterally, no spinal tenderness, mild kyphosis   Neurologic: alert and oriented, cranial nerves grossly intact, cerebellar function intact, good insight    No results found for any visits on 01/05/15.          Assessment/Plan :  Lindsay Liu was seen today for labs.  Doing well, After completing prescription st Vit D take 2000 units daily  Cut Vit B12 in half  Cont efforts at healthy diet and weight loss  Trial off atorvastatin when weight gets to 150 as per pt request    Diagnoses and all orders for this visit:    Pure hypercholesterolemia    Vitamin D deficiency    B12 deficiency    Obesity (BMI 30.0-34.9)-with weight loss BMI = 29. Continue healthy diet and ex    Encounter for immunization  -     Tetanus, diphtheria toxoids and acellular pertussis (TDAP) vaccine, in individuals >=7 years, IM  -     Influenza vaccine (QUADRIVALENT VIAL) 3 years and older IM        Follow up :   Follow-up Disposition:  Return in about 8 months (around 09/14/2015) for CPE.    Care Plan discussed with :   Patient   Family    >50% of visit spent in counseling and coordination of care    Discussed Advanced Directives,   smoking cessation    diet   exercise      weight loss recommended  seat belts  medication compliance/usage/side effect  time spent 25 mins    I have reviewed patient's allergies, past med/surgical/family hx, smoking status, problem lists, and have completed medication reconciliation for today's visit.        Lindsay Gula, NP

## 2015-06-11 ENCOUNTER — Encounter

## 2015-07-03 ENCOUNTER — Encounter

## 2015-07-03 NOTE — Progress Notes (Signed)
mammography is stable.  please repeat mammography in one year

## 2015-07-03 NOTE — Progress Notes (Signed)
Patient returned call advised of results

## 2015-07-03 NOTE — Progress Notes (Signed)
LM for pt to c/b

## 2015-08-27 ENCOUNTER — Encounter

## 2015-09-04 NOTE — Progress Notes (Signed)
Blood work looks good except for mild elevation of cholesterol.  We will discuss this further at your visit on June 8.  Your urinalysis shows signs of a possible infection.  If you are having any symptoms I would like you do to a repeat urine test and culture.  If you are not having symptoms and I do not want to do anything

## 2015-09-20 ENCOUNTER — Ambulatory Visit
Admit: 2015-09-20 | Discharge: 2015-09-20 | Payer: PRIVATE HEALTH INSURANCE | Attending: Internal Medicine | Primary: Internal Medicine

## 2015-09-20 DIAGNOSIS — F17200 Nicotine dependence, unspecified, uncomplicated: Secondary | ICD-10-CM

## 2015-09-20 NOTE — Progress Notes (Signed)
Internal Medicine Progress Note    Chief Complaint:   Hyperlipidemia and weight loss    History of present illness:    Lindsay Liu is a 50 y.o. female with a past medical history as below who comes for review of her blood work.  Patient was noted to have an elevated cholesterol level.  Since her last visit the patient has lost a significant amount of weight and her BMI is now 24.  She continues to smoke an average of 7 cigarettes a day.  We reviewed her blood work with her and ran her numbers through the ASCVD calculator.  We ran several scenarios to discuss with her the importance of smoking cessation in addition to improving her cholesterol.  She is currently in the non-statin benefit group however if nothing else changes continuing to smoke will increase her risk of coronary artery risk in the next 8 years.                    Review of Systems:  Review of Systems is negative for chest pain, chest pressure, nausea, vomiting, diarrhea, headache, fever, chills, hemoptysis, epistaxis, hematemesis, bright red blood per rectum, hematuria, dysuria, dyspnea, orthopnea, melena, depression, anxiety, or any recent unexplained weight loss or weight gain except as mentioned in HPI.      Current Outpatient Prescriptions   Medication Sig Dispense Refill   ??? diclofenac EC (VOLTAREN) 50 mg EC tablet   1   ??? cyanocobalamin 1,000 mcg tablet Take 1,000 mcg by mouth daily.     ??? diclofenac potassium (CATAFLAM) 50 mg tablet Take 50 mg by mouth two (2) times a day.            Allergies   Allergen Reactions   ??? Levaquin [Levofloxacin] Shortness of Breath     Cipro in the past without adverse effect   ??? Penicillin G Shortness of Breath           HISTORY:  Past Medical History:   Diagnosis Date   ??? Cervical spine pain     Oh   ??? Chronic urticaria    ??? Endometriosis    ??? Hypercholesterolemia    ??? Thyroid disease        Past Surgical History:   Procedure Laterality Date   ??? HX APPENDECTOMY     ??? HX CHOLECYSTECTOMY  01/1994    ??? HX LAPAROTOMY         Family History   Problem Relation Age of Onset   ??? Elevated Lipids Mother    ??? Heart Disease Mother    ??? Heart Disease Father    ??? Cancer Mother 61     multiple myeloma   ??? Elevated Lipids Brother        Social History     Social History   ??? Marital status: MARRIED     Spouse name: N/A   ??? Number of children: 2   ??? Years of education: N/A     Occupational History   ???  Self Employed     Social History Main Topics   ??? Smoking status: Current Every Day Smoker     Years: 30.00     Types: Cigarettes   ??? Smokeless tobacco: Never Used      Comment: 5-7 cigarettes per day.   ??? Alcohol use 0.0 oz/week     0 Standard drinks or equivalent per week      Comment: social   ??? Drug use: No   ???  Sexual activity: Not on file     Other Topics Concern   ??? Not on file     Social History Narrative       Immunization History   Administered Date(s) Administered   ??? Influenza Vaccine (Quad) 01/05/2015   ??? Influenza Vaccine PF 02/24/2013   ??? Tdap 01/05/2015             Objective:   Vitals:          Vitals:    09/20/15 1525   BP: 115/80   Pulse: 78   Temp: 97.8 ??F (36.6 ??C)   TempSrc: Oral   SpO2: 99%   Weight: 138 lb (62.6 kg)   Height: 5' 2.5" (1.588 m)       PHYSICAL EXAM:    General:    Alert, cooperative, no distress, appears stated age.     HEENT: Atraumatic, anicteric sclerae, pink conjunctivae     No oral ulcers, mucosa moist, throat clear  Neck:  Supple, symmetrical,  thyroid: non tender  Back:    No CVA tenderness.  Lungs:   Clear to auscultation bilaterally.  No Wheezing or Rhonchi. No rales.  Chest wall:  No tenderness  No Accessory muscle use.  Heart:   Regular  rhythm,  No  murmur   No gallop.  No edema  Abdomen:   Soft, non-tender. Not distended.  Bowel sounds normal. No masses  Extremities: No cyanosis.  No clubbing No edema  Skin:     Not pale Not Jaundiced  No rashes   Lymph nodes: Cervical, supraclavicular normal.  Psych:  Good insight.  Not depressed.  Not anxious or agitated.   Neurologic: EOMs intact. No facial asymmetry. No aphasia or slurred speech. Normal   strength, Alert and oriented X 3.     Lab Data Reviewed: No visits with results within 3 Month(s) from this visit.  Latest known visit with results is:    Office Visit on 09/15/2014   Component Date Value Ref Range Status   ??? Amb DEXA, External 04/14/2014 MRI   Final      Labs reviewed with patient from [place] and copy given from early May    EKG No results found for any visits on 09/20/15.            Assessment/Plan:   1. Current every day smoker  .SMOKING CESSATION  The patient was counseled on the dangers of tobacco use, and was advised to quit. Reviewed strategies to maximize success, including removing cigarettes and smoking materials from environment, stress management, substitution of other forms of reinforcement, support of family/friends, written materials, local smoking cessation programs([place]) and pharmacotherapy (wellbutrin, chantix, nicotine patch).    - SMOKING AND TOBACCO USE CESSATION 3 - 10 MINUTES    2. Hyperlipidemia, unspecified hyperlipidemia type  Continue current diet and exercise patterns.  Currently not in statin benefit group             Care Plan discussed with:   Patient   Family    Care Manager    Nursing   Consultant/Specialist :     >50% of visit spent in counseling and coordination of care       Discussed     Advanced Directives,   smoking cessation    diet   excercise   weight loss recommended  seat belts  sun screen usage  safe sex practices  [  ] texting while driving  [  ]cell phone use while driving [x ]  medication compliance/usage/side effect           Follow up in Follow-up Disposition: Not on File    Total time spent with patient:  30 minutes          Note: This report was transcribed using voice recognition software and is thus   prone to syntax and contextual spelling errors. Please do not hesitate to call   me if anything needs clarification or a dictated addendum    ___________________________________________________     Domingo Mend, MD     ___________________________________________________

## 2015-09-21 ENCOUNTER — Encounter

## 2015-09-25 MED ORDER — TRIMETHOPRIM-SULFAMETHOXAZOLE 160 MG-800 MG TAB
160-800 mg | ORAL_TABLET | Freq: Two times a day (BID) | ORAL | 0 refills | Status: AC
Start: 2015-09-25 — End: 2015-09-28

## 2015-09-25 NOTE — Progress Notes (Signed)
Urine culture shows a few bacteria however urinalysis is now normal.  If she still having symptoms I would start antibiotics however if not I would not give her antibiotics

## 2015-09-25 NOTE — Telephone Encounter (Signed)
Call spoke with patient - she states she feels that the sypmtoms are still there / but not as severe - she welcomes antibiotics

## 2015-09-25 NOTE — Telephone Encounter (Signed)
Please tell her that I sent a prescription to her pharmacy for Bactrim DS 1 tablet twice daily for 3 days

## 2015-12-26 ENCOUNTER — Ambulatory Visit
Admit: 2015-12-26 | Discharge: 2015-12-26 | Payer: PRIVATE HEALTH INSURANCE | Attending: Internal Medicine | Primary: Internal Medicine

## 2015-12-26 ENCOUNTER — Encounter

## 2015-12-26 DIAGNOSIS — Z0000 Encounter for general adult medical examination without abnormal findings: Secondary | ICD-10-CM

## 2015-12-26 MED ORDER — DICLOFENAC 50 MG TAB, DELAYED RELEASE
50 mg | ORAL_TABLET | Freq: Two times a day (BID) | ORAL | 1 refills | Status: DC
Start: 2015-12-26 — End: 2017-01-14

## 2015-12-26 NOTE — Progress Notes (Signed)
Chief Complaint:   Complete Physical      History of present illness:    Lindsay Liu is a 50 y.o. female with a past medical history as below who comes for annual complete physical exam.     She is doing well except for the occasional back pain secondary to spinal issues.  She uses diclofenac only as needed.    She continues to smoke 5-7 cigarettes a day and currently is not interested in stopping smoking until she can get her weight down about 10 pounds.  We discussed various smokes cessation strategies including medications and counseling                PHQ over the last two weeks 12/26/2015   Little interest or pleasure in doing things Not at all   Feeling down, depressed or hopeless Not at all   Total Score PHQ 2 0   Trouble falling or staying asleep, or sleeping too much Not at all   Feeling tired or having little energy Not at all   Poor appetite or overeating Not at all   Feeling bad about yourself - or that you are a failure or have let yourself or your family down Not at all   Trouble concentrating on things such as school, work, reading or watching TV Not at all   Moving or speaking so slowly that other people could have noticed; or the opposite being so fidgety that others notice Not at all   Thoughts of being better off dead, or hurting yourself in some way Not at all   PHQ 9 Score 0   How difficult have these problems made it for you to do your work, take care of your home and get along with others Not difficult at all         Review of Systems: Positive for-neck and back pain  Review of Systems is negative for chest pain, chest pressure, nausea, vomiting, diarrhea, headache, fever, chills, hemoptysis, epistaxis, hematemesis, bright red blood per rectum, hematuria, dysuria, dyspnea, orthopnea, melena, depression, anxiety, or any recent unexplained weight loss or weight gain except as mentioned in HPI.      Prior to Admission medications     Medication Sig Start Date End Date Taking? Authorizing Provider   cholecalciferol, vitamin D3, (VITAMIN D3) 2,000 unit tab Take  by mouth.   Yes Historical Provider   diclofenac EC (VOLTAREN) 50 mg EC tablet Take 1 Tab by mouth two (2) times a day. Indications: OSTEOARTHRITIS 12/26/15  Yes Domingo Mend, MD   cyanocobalamin 1,000 mcg tablet Take 1,000 mcg by mouth daily.   Yes Historical Provider            Allergies   Allergen Reactions   ??? Levaquin [Levofloxacin] Shortness of Breath     Cipro in the past without adverse effect   ??? Penicillin G Shortness of Breath           HISTORY:  Past Medical History:   Diagnosis Date   ??? Cervical spine pain     Oh   ??? Chronic urticaria    ??? Endometriosis    ??? Hypercholesterolemia    ??? Thyroid disease        Past Surgical History:   Procedure Laterality Date   ??? HX APPENDECTOMY     ??? HX CHOLECYSTECTOMY  01/1994   ??? HX LAPAROTOMY         Family History   Problem Relation Age of Onset   ???  Elevated Lipids Mother    ??? Heart Disease Mother    ??? Heart Disease Father    ??? Cancer Mother 57     multiple myeloma   ??? Elevated Lipids Brother        Social History     Social History   ??? Marital status: MARRIED     Spouse name: Shanon Brow   ??? Number of children: 2   ??? Years of education: N/A     Occupational History   ??? business owner-insurance Self Employed     Social History Main Topics   ??? Smoking status: Current Every Day Smoker     Packs/day: 0.38     Years: 30.00     Types: Cigarettes   ??? Smokeless tobacco: Never Used      Comment: 5-7 cigarettes per day.   ??? Alcohol use 0.6 oz/week     1 Standard drinks or equivalent per week      Comment: social   ??? Drug use: No   ??? Sexual activity: Yes     Partners: Male     Birth control/ protection: None     Other Topics Concern   ??? Not on file     Social History Narrative       Immunization History   Administered Date(s) Administered   ??? Influenza Vaccine (Quad) 01/05/2015   ??? Influenza Vaccine PF 02/24/2013   ??? Tdap 01/05/2015        Patient Care Team:  Domingo Mend, MD as PCP - General (Internal Medicine)  Desma Mcgregor, MD (Orthopedic Surgery)  Everardo Pacific, NP  Lujean Amel, MD (Gastroenterology)      Objective:   Vitals:          Vitals:    12/26/15 1312   Pulse: 76   Resp: 14   Temp: 98.3 ??F (36.8 ??C)   TempSrc: Oral   SpO2: 94%   Weight: 140 lb (63.5 kg)   Height: '5\' 3"'  (1.6 m)       PHYSICAL EXAM:    General:    Alert, cooperative, no distress, appears stated age.     HEENT: Atraumatic, anicteric sclerae, pink conjunctivae     No oral ulcers, mucosa moist, throat clear  Neck:  Supple, symmetrical,  thyroid: non tender  Back:    No CVA tenderness.  Lungs:   Clear to auscultation bilaterally.  No Wheezing or Rhonchi. No rales.  Chest wall:  No tenderness  No Accessory muscle use.  Heart:   Regular  rhythm,  No  murmur   No gallop.  No edema  Abdomen:   Soft, non-tender. Not distended.  Bowel sounds normal. No masses  Extremities: No cyanosis.  No clubbing No edema  Skin:     Not pale Not Jaundiced  No rashes   Lymph nodes: Cervical, supraclavicular normal.  Psych:  Good insight.  Not depressed.  Not anxious or agitated.  Neurologic: EOMs intact. No facial asymmetry. No aphasia or slurred speech. Normal   strength, Alert and oriented X 3.   Breasts: breasts appear normal, no suspicious masses, no skin or nipple changes or axillary nodes.    Lab Data Reviewed:   No visits with results within 3 Month(s) from this visit.  Latest known visit with results is:    Office Visit on 09/15/2014   Component Date Value Ref Range Status   ??? Amb DEXA, External 04/14/2014 MRI   Final  EKG No results found for any visits on 12/26/15.      Health Maintenance   Topic Date Due   ??? Hepatitis C Screening  10-08-1965   ??? Pneumococcal 19-64 Medium Risk (1 of 1 - PPSV23) 02/23/1983   ??? INFLUENZA AGE 36 TO ADULT  11/13/2015   ??? BREAST CANCER SCRN MAMMOGRAM  07/02/2017   ??? PAP AKA CERVICAL CYTOLOGY  09/14/2017   ??? COLONOSCOPY  03/20/2020    ??? DTaP/Tdap/Td series (2 - Td) 01/04/2025         Assessment/Plan:   1. Well woman exam (no gynecological exam)  Patient up-to-date on recommended screenings and immunizations.  Labs from May reviewed with patient.  Patient will get flu shot this fall    2. Current every day smoker  *SMOKING CESSATION  The patient was counseled on the dangers of tobacco use, and was advised to quit. Reviewed strategies to maximize success, including removing cigarettes and smoking materials from environment, stress management, substitution of other forms of reinforcement, support of family/friends, written materials, local smoking cessation programs([facility]) and pharmacotherapy (wellbutrin, chantix, nicotine patch).      3. Hyperlipidemia, unspecified hyperlipidemia type  Diet and exercise discussed        The patient is advised to quit smoking, begin progressive daily aerobic exercise program, follow a low fat, low cholesterol diet, improve dietary compliance, continue current medications, continue current healthy lifestyle patterns and return for routine annual checkups.        Care Plan discussed with:   Patient   Family    Care Manager    Nursing   Consultant/Specialist :     >50% of visit spent in counseling and coordination of care       Discussed     Advanced Directives,   smoking cessation    diet   excercise   weight loss recommended  seat belts  sun screen usage  safe sex practices  [  ] texting while driving  [  ]cell phone use while driving[x ] medication usage, compliance, and side effects          Follow up in Follow-up Disposition:  Return in about 1 year (around 12/25/2016).          Note: This report was transcribed using voice recognition software and is thus   prone to syntax and contextual spelling errors. Please do not hesitate to call   me if anything needs clarification or a dictated addendum         ___________________________________________________     Domingo Mend, MD      ___________________________________________________

## 2016-01-25 ENCOUNTER — Ambulatory Visit
Admit: 2016-01-25 | Discharge: 2016-01-25 | Payer: PRIVATE HEALTH INSURANCE | Attending: Nurse Practitioner | Primary: Internal Medicine

## 2016-01-25 ENCOUNTER — Encounter

## 2016-01-25 DIAGNOSIS — R350 Frequency of micturition: Secondary | ICD-10-CM

## 2016-01-25 LAB — AMB POC URINE DIP STICK MANUAL W/O MICRO CHEMSTRIP-10
Bilirubin (UA POC): NEGATIVE
Glucose (UA POC): NEGATIVE mg/dL
Ketones (UA POC): NEGATIVE
Nitrites (UA POC): POSITIVE
Protein (UA POC): NEGATIVE mg/dL
Specific gravity (UA POC): 1.01 (ref 1–1.03)
Urobilinogen (UA POC): NORMAL
pH (UA POC): 5 (ref 5–9)

## 2016-01-25 MED ORDER — NITROFURANTOIN (25% MACROCRYSTAL FORM) 100 MG CAP
100 mg | ORAL_CAPSULE | Freq: Two times a day (BID) | ORAL | 0 refills | Status: DC
Start: 2016-01-25 — End: 2016-01-28

## 2016-01-25 NOTE — Progress Notes (Signed)
SUBJECTIVE: Lindsay Liu is a 50 y.o. female with hx of endometriosis, chronic urticaria, who complains of urinary frequency, urgency and dysuria x 2 days, who has been taking pyridium since yesterday for her severe dysuria.  Has light brown spotting x 2 days too despite being in menopause x 2 years.  She denies flank pain, fever, chills, or abnormal vaginal discharge.     Past Medical History Reviewed    Allergies   Allergen Reactions   ??? Levaquin [Levofloxacin] Shortness of Breath     Cipro in the past without adverse effect   ??? Penicillin G Shortness of Breath     Prior to Admission medications    Medication Sig Start Date End Date Taking? Authorizing Provider   loratadine (CLARITIN) 10 mg tablet Take 10 mg by mouth.   Yes Historical Provider   nitrofurantoin, macrocrystal-monohydrate, (MACROBID) 100 mg capsule Take 1 Cap by mouth two (2) times a day for 5 days. 01/25/16 01/30/16 Yes Leonard Schwartz, NP   cholecalciferol, vitamin D3, (VITAMIN D3) 2,000 unit tab Take  by mouth.   Yes Historical Provider   cyanocobalamin 1,000 mcg tablet Take 1,000 mcg by mouth daily.   Yes Historical Provider   diclofenac EC (VOLTAREN) 50 mg EC tablet Take 1 Tab by mouth two (2) times a day. Indications: OSTEOARTHRITIS 12/26/15   Pricilla Handler, MD       OBJECTIVE:   Visit Vitals   ??? BP 117/74 (BP 1 Location: Right arm, BP Patient Position: Sitting)   ??? Pulse 74   ??? Temp 98.4 ??F (36.9 ??C)   ??? Resp 14   ??? Ht 5\' 3"  (1.6 m)   ??? Wt 138 lb (62.6 kg)   ??? SpO2 98%   ??? BMI 24.45 kg/m2       Appears well, in no apparent distress.  Vital signs are normal. Alert and Oriented x 3, Suprapubic tenderness otherwise The abdomen is soft without tenderness, guarding, mass, rebound or organomegaly. No CVA tenderness or inguinal adenopathy noted.     Urine dipstick shows:     Results for orders placed or performed in visit on 01/25/16   AMB POC URINE DIP STICK MANUAL W/O MICRO CHEMSTRIP-10   Result Value Ref Range    Color (UA POC) Orange      Clarity (UA POC) Turbid     Specific gravity (UA POC) 1.010 1 - 1.03    pH (UA POC) 5 5 - 9    Leukocyte esterase (UA POC) Trace Negative    Nitrites (UA POC) Positive Negative    Protein (UA POC) Negative Negative mg/dL    Glucose (UA POC) Negative Negative mg/dL    Ketones (UA POC) Negative Negative    Urobilinogen (UA POC) normal Neg    Bilirubin (UA POC) Negative Negative    Blood (UA POC) Trace Negative     Urine culture pending.          ASSESSMENT & PLAN:   Diagnoses and all orders for this visit:    1. UTI with Urinary frequency and Dysuria  -     AMB POC URINE DIP STICK MANUAL W/O MICRO CHEMSTRIP-10  -     nitrofurantoin, macrocrystal-monohydrate, (MACROBID) 100 mg capsule; Take 1 Cap by mouth two (2) times a day for 5 days.    UTI uncomplicated without evidence of pyelonephritis  Treatment per orders - also push fluids, may use Pyridium OTC prn. Call or return to clinic prn if these symptoms worsen  or fail to improve as anticipated.    Hydrate  Cranberry tablets  Avoid acidic foods  Avoid Caffeine  Avoid fake sugar  Perineal hygiene   Call with worsening symptoms    Macrobid 2x/day x 5 days   Probiotic daily  Advise referral to GYN re spotting       Care Plan discussed with:    Patient   Family    Care Manager    Nursing   Consultant/Specialist :  re   (Discussed CODE status,  Care Plan, D/C Planning)        >50% of visit spent in counseling and coordination of care      Total time spent with patient:  15   minutes      Leonard Schwartzachel Latanya Hemmer, NP  01/25/2016    Patient Care Team:  Pricilla HandlerLisa A Ferrara, MD as PCP - General (Internal Medicine)  Ilean Chinahong K Oh, MD (Orthopedic Surgery)  Lita Mainsebra Gilhooly, NP  Markus DaftSharon Molinas, MD (Gastroenterology)

## 2016-01-25 NOTE — Progress Notes (Signed)
I reviewed the patient's medical history, the nurse practitioner's findings on physical examination, the patient's diagnoses, and treatment plan as documented in the progress note. I concur with the treatment plan as documented. There are no additional recommendations at this time.    Rogerio Boutelle, MD

## 2016-01-25 NOTE — Patient Instructions (Addendum)
Hydrate  Cranberry tablets  Avoid acidic foods  Avoid Caffeine  Avoid fake sugar  Perineal hygiene   Call with worsening symptoms    Macrobid 2x/day x 5 days   Probiotic daily  Advise referral to GYN re spotting

## 2016-01-26 LAB — URINALYSIS W/MICROSCOPIC
Bilirubin: NEGATIVE
Blood: NEGATIVE
Crystals, urine: NONE SEEN /HPF
Epithelial Cells: NONE SEEN /HPF
Glucose: NEGATIVE mg/dL
Ketone: NEGATIVE mg/dL
Nitrites: POSITIVE — ABNORMAL HIGH
Protein: NEGATIVE mg/dL
Specific gravity: 1.004 R.I. — ABNORMAL LOW (ref 1.005–1.030)
Urobilinogen: 1 EU/dL (ref 0.2–1.0)
Yeast, urine: NONE SEEN /HPF
pH (UA): 6 (ref 5.0–8.0)

## 2016-01-28 ENCOUNTER — Telehealth

## 2016-01-28 LAB — ANTIMICROBIAL SUSCEPTIBILITY

## 2016-01-28 LAB — CULTURE, URINE

## 2016-01-28 MED ORDER — CIPROFLOXACIN 250 MG TAB
250 mg | ORAL_TABLET | Freq: Two times a day (BID) | ORAL | 0 refills | Status: AC
Start: 2016-01-28 — End: 2016-02-02

## 2016-01-28 NOTE — Telephone Encounter (Signed)
Pt saw you on friday for a UTI and taking meds, claims she is not feeling any better. Please advise.

## 2016-01-28 NOTE — Telephone Encounter (Signed)
Please obtain urinalysis and culture from Friday to determine antibiotic [facility]

## 2016-01-28 NOTE — Progress Notes (Signed)
Spoke to pt: to stop macrobid, and start cipro.  UTI sensitive to cipro.  Previous dose tolerated despite allergy to levaquin.

## 2016-01-28 NOTE — Telephone Encounter (Signed)
Spoke to pt: to stop macrobid, and start cipro.  UTI sensitive to cipro.  Previous dose tolerated despite allergy to levaquin.

## 2016-01-28 NOTE — Telephone Encounter (Signed)
Spoke with Fleet Contrasachel who reviewed the preliminary lab results and confirmed pt has UTI. RM does not want to change antibiotic until she gets final results and wants to confirm with pt that she follows up with a gyno for the spotting. After leaving a vm for pt..she called back and was advised by NC of the above.

## 2016-10-08 ENCOUNTER — Ambulatory Visit
Admit: 2016-10-08 | Discharge: 2016-10-08 | Payer: PRIVATE HEALTH INSURANCE | Attending: Nurse Practitioner | Primary: Internal Medicine

## 2016-10-08 DIAGNOSIS — H00015 Hordeolum externum left lower eyelid: Secondary | ICD-10-CM

## 2016-10-08 NOTE — Patient Instructions (Signed)
Continue warm compresses 4x/day  Advise referral to ophtho for persistent/ worsening symptoms

## 2016-10-08 NOTE — Progress Notes (Signed)
I reviewed the patient's medical history, the nurse practitioner's findings on physical examination, the patient's diagnoses, and treatment plan as documented in the progress note. I concur with the treatment plan as documented. There are no additional recommendations at this time.    Czarina Gingras A Farley Crooker, MD

## 2016-10-08 NOTE — Progress Notes (Signed)
SUBJECTIVE:   51 y.o. female with with hx as below, complains of stye on left upper eye lid and left lower lid.  On and off x 2 weeks.  Returned about 6 days ago.  Left lower lid stye is painful, but not improving.  No other symptoms.  No significant prior ophthalmological history. No change in visual acuity, no photophobia, no severe eye pain.  Denies any new floaters, flashes, decreased peripheral vision.  See's Dr Otho Perl for ophtho.        Past Medical History:   Diagnosis Date   ??? Cervical spine pain     Oh   ??? Chronic urticaria    ??? Endometriosis    ??? Hypercholesterolemia    ??? Thyroid disease      Family History   Problem Relation Age of Onset   ??? Elevated Lipids Mother    ??? Heart Disease Mother    ??? Cancer Mother 31     multiple myeloma   ??? Heart Disease Father    ??? Elevated Lipids Brother        Allergies   Allergen Reactions   ??? Levaquin [Levofloxacin] Shortness of Breath     Cipro in the past without adverse effect   ??? Penicillin G Shortness of Breath     Prior to Admission medications    Medication Sig Start Date End Date Taking? Authorizing Provider   cholecalciferol, vitamin D3, (VITAMIN D3) 2,000 unit tab Take  by mouth.   Yes Historical Provider   cyanocobalamin 1,000 mcg tablet Take 1,000 mcg by mouth daily.   Yes Historical Provider   loratadine (CLARITIN) 10 mg tablet Take 10 mg by mouth.    Historical Provider   diclofenac EC (VOLTAREN) 50 mg EC tablet Take 1 Tab by mouth two (2) times a day. Indications: OSTEOARTHRITIS 12/26/15   Domingo Mend, MD         OBJECTIVE:   Patient appears well, vitals signs are normal.Alert and Oriented x 3, CV: RRR. Eyes: left upper lid chalazion, left lower lid hordoleum, with out findings of typical conjunctivitis noted; erythema and discharge. PERRLA, no foreign body noted. No periorbital cellulitis. The corneas are clear and fundi normal. Visual acuity normal.     Visit Vitals   ??? BP 96/50 (BP 1 Location: Right arm, BP Patient Position: Sitting)   ??? Pulse 79    ??? Temp 98.3 ??F (36.8 ??C) (Oral)   ??? Resp 14   ??? Ht '5\' 3"'  (1.6 m)   ??? Wt 139 lb (63 kg)   ??? SpO2 97%   ??? BMI 24.62 kg/m2          ASSESSMENT & PLAN:   Diagnoses and all orders for this visit:    1. Hordeolum externum of left lower eyelid  -     REFERRAL TO OPHTHALMOLOGY    2. Chalazion of left upper eyelid  -     REFERRAL TO OPHTHALMOLOGY        Continue warm compresses 4x/day  Advise referral to ophtho for persistent/ worsening symptoms      Care Plan discussed with:    '[x]' Patient   '[]' Family    '[]' Care Manager    '[]' Nursing   '[]' Consultant/Specialist :  re   (Discussed '[]' CODE status,  '[]' Care Plan, '[]' D/C Planning)      '[x]'   >50% of visit spent in counseling and coordination of care    Total time spent with patient: 15    minutes  Marlinda Mike, NP  10/08/2016

## 2016-12-13 ENCOUNTER — Encounter

## 2016-12-25 ENCOUNTER — Encounter

## 2016-12-29 ENCOUNTER — Encounter: Attending: Internal Medicine | Primary: Internal Medicine

## 2016-12-31 LAB — URINALYSIS W/ RFLX MICROSCOPIC
Bacteria: NONE SEEN /hpf
Bilirubin: NEGATIVE
Glucose: NEGATIVE
Hyaline cast: NONE SEEN /lpf
Ketone: NEGATIVE
Leukocyte Esterase: NEGATIVE
Nitrites: NEGATIVE
Protein: NEGATIVE
RBC: NONE SEEN /hpf (ref <=2)
SQUAMOUS EPITHELIAL CELLS: NONE SEEN /hpf (ref <=5)
Specific gravity: 1.02 (ref 1.001–1.035)
WBC: NONE SEEN /hpf (ref <=5)
pH (UA): 5 (ref 5.0–8.0)

## 2016-12-31 LAB — METABOLIC PANEL, COMPREHENSIVE
ALB/GLOBRATIO: 2.2 (calc) (ref 1.0–2.5)
ALT (SGPT): 14 U/L (ref 6–29)
AST (SGOT): 14 U/L (ref 10–35)
Albumin: 4.1 g/dL (ref 3.6–5.1)
Alk. phosphatase: 62 U/L (ref 33–130)
BUN: 17 mg/dL (ref 7–25)
Bilirubin, total: 0.4 mg/dL (ref 0.2–1.2)
CO2: 28 mmol/L (ref 20–32)
Calcium: 9.4 mg/dL (ref 8.6–10.4)
Chloride: 107 mmol/L (ref 98–110)
Creatinine: 0.78 mg/dL (ref 0.50–1.05)
EGFR NON AFR AMERICAN: 87 mL/min/{1.73_m2} (ref >=60)
GFR est AA: 101 mL/min/{1.73_m2} (ref >=60)
Globulin: 1.9 g/dL (calc) (ref 1.9–3.7)
Glucose: 92 mg/dL (ref 65–99)
Potassium: 4.4 mmol/L (ref 3.5–5.3)
Protein, total: 6 g/dL — ABNORMAL LOW (ref 6.1–8.1)
Sodium: 142 mmol/L (ref 135–146)

## 2016-12-31 LAB — LIPID PANEL
Cholesterol, total: 225 mg/dL — ABNORMAL HIGH (ref <200)
Cholesterol/HDL ratio: 3.8 calc (ref <5.0)
HDL Cholesterol: 60 mg/dL (ref >50)
LDL CHOL, CALCULATED: 147 mg/dL — ABNORMAL HIGH (ref <100)
Non-HDL Cholesterol: 165 mg/dL (calc) — ABNORMAL HIGH (ref <130)
Triglyceride: 81 mg/dL (ref <150)

## 2016-12-31 LAB — VITAMIN D, 25 HYDROXY: VITAMIN D, 25-HYDROXY: 36 ng/mL (ref 30–100)

## 2016-12-31 LAB — CBC WITH AUTOMATED DIFF
ABS. BASOPHILS: 24 Cells/mcL (ref 0–200)
ABS. EOSINOPHILS: 78 Cells/mcL (ref 15–500)
ABS. LYMPHOCYTES: 1722 Cells/mcL (ref 850–3900)
ABS. MONOCYTES: 288 Cells/mcL (ref 200–950)
ABS. NEUTROPHILS: 3888 Cells/mcL (ref 1500–7800)
BASOPHILS: 0.4 % (ref 0–2)
EOSINOPHILS: 1.3 % (ref 0–8)
HCT: 41.9 % (ref 35.0–45.0)
HGB: 13.9 g/dL (ref 11.7–15.5)
LYMPHOCYTES: 28.7 % (ref 15–49)
MCH: 31.8 pg (ref 27.0–33.0)
MCHC: 33.3 g/dL (ref 32.0–36.0)
MCV: 95.6 fL (ref 80.0–100.0)
MEAN PLATELET VOLUME: 9.6 fL (ref 7.5–12.5)
MONOCYTES: 4.8 % (ref 0–13)
Neutrophils: 64.8 % (ref 38–80)
PLATELET: 211 10*3/uL (ref 140–400)
RBC: 4.39 10*6/uL (ref 3.80–5.10)
RDW: 14 % (ref 11.0–15.0)
WBC: 6 10*3/uL (ref 3.8–10.8)

## 2016-12-31 LAB — HEPATITIS C AB, RFLX TO QT BY PCR
HCV RATIO: 0 (ref <1.0)
HCV RATIO: 0 (ref <1.0)
HEPATITIS C AB,HCAB: NONREACTIVE
Hepatitis C Ab: NONREACTIVE

## 2016-12-31 LAB — VITAMIN B12: Vitamin B12: 1077 pg/mL (ref 200–1100)

## 2016-12-31 LAB — FERRITIN: Ferritin: 78 ng/mL (ref 10–232)

## 2016-12-31 LAB — TSH REFLEX TO T4F: TSH: 1.33 mIU/L (ref 0.40–4.50)

## 2016-12-31 NOTE — Progress Notes (Signed)
Labs are normal except for elevated cholesterol.  We will discuss this further at your upcoming visit

## 2017-01-14 ENCOUNTER — Ambulatory Visit
Admit: 2017-01-14 | Discharge: 2017-01-14 | Payer: PRIVATE HEALTH INSURANCE | Attending: Internal Medicine | Primary: Internal Medicine

## 2017-01-14 DIAGNOSIS — Z0000 Encounter for general adult medical examination without abnormal findings: Secondary | ICD-10-CM

## 2017-01-14 MED ORDER — DICLOFENAC 50 MG TAB, DELAYED RELEASE
50 mg | ORAL_TABLET | Freq: Two times a day (BID) | ORAL | 1 refills | Status: DC
Start: 2017-01-14 — End: 2017-10-12

## 2017-01-14 NOTE — Progress Notes (Signed)
Chief Complaint:   Complete Physical; Other (requesting prescription vit d due to low levels.); and Stye (to left eyelid since 09/2016)      History of present illness:    Lindsay Liu is a 51 y.o. female with a past medical history as below who comes for annual complete physical exam.   She continues to smoke 5-7 cigarettes a day.  She feels that she wants to stop but she is not ready yet.  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  She is trying to get more help and then is planning to take a month off to "cataract and gear.  During this time she would like to start exercising, dieting and stop smoking.  She plans to do this in November.    Reviewed her blood work.  She will be seeing her gynecologist before the end of the year and also having a mammography.    We discussed the influenza, Pneumovax, and shingles vaccine.  She will check her insurance regarding the coverage of the shingles vaccine.  She received the other vaccines today in the office.    She has been seeing her ophthalmologist secondary to recurrent stye in her left eyelid.    She continues to use the Voltaren on an as-needed basis    Her recurrent urinary tract infections have improved since starting probiotics            PHQ over the last two weeks 10/08/2016   Little interest or pleasure in doing things Not at all   Feeling down, depressed, irritable, or hopeless Not at all   Total Score PHQ 2 0   Trouble falling or staying asleep, or sleeping too much Several days   Feeling tired or having little energy Not at all   Poor appetite, weight loss, or overeating Not at all   Feeling bad about yourself - or that you are a failure or have let yourself or your family down Not at all   Trouble concentrating on things such as school, work, reading, or watching TV Not at all   Moving or speaking so slowly that other people could have noticed; or the opposite being so fidgety that others notice Not at all    Thoughts of being better off dead, or hurting yourself in some way Not at all   PHQ 9 Score 1   How difficult have these problems made it for you to do your work, take care of your home and get along with others Not difficult at all         Review of Systems: Positive for-stye  Review of Systems is negative for chest pain, chest pressure, nausea, vomiting, diarrhea, headache, fever, chills, hemoptysis, epistaxis, hematemesis, bright red blood per rectum, hematuria, dysuria, dyspnea, orthopnea, melena, depression, anxiety, or any recent unexplained weight loss or weight gain except as mentioned in HPI.      Prior to Admission medications    Medication Sig Start Date End Date Taking? Authorizing Provider   Lactobacillus acidophilus (PROBIOTIC PO) Take  by mouth daily.   Yes Historical Provider   cetirizine HCl (ZYRTEC PO) Take  by mouth as needed.   Yes Historical Provider   cholecalciferol, vitamin D3, (VITAMIN D3) 2,000 unit tab Take 2,000 Units by mouth daily.   Yes Historical Provider   diclofenac EC (VOLTAREN) 50 mg EC tablet Take 1 Tab by mouth two (2) times a day. Indications: OSTEOARTHRITIS 12/26/15  Yes Domingo Mend,  MD   cyanocobalamin 1,000 mcg tablet Take 1,000 mcg by mouth daily.   Yes Historical Provider            Allergies   Allergen Reactions   ??? Levaquin [Levofloxacin] Shortness of Breath     Cipro in the past without adverse effect   ??? Penicillin G Shortness of Breath           HISTORY:  Past Medical History:   Diagnosis Date   ??? Cervical spine pain     Oh   ??? Chronic urticaria    ??? Endometriosis    ??? Hypercholesterolemia    ??? Stye 10/2016    Dr Otho Perl -> [facility]   ??? Thyroid disease        Past Surgical History:   Procedure Laterality Date   ??? HX APPENDECTOMY     ??? HX CHOLECYSTECTOMY  01/1994   ??? HX LAPAROTOMY         Family History   Problem Relation Age of Onset   ??? Elevated Lipids Mother    ??? Heart Disease Mother    ??? Cancer Mother 12     multiple myeloma   ??? Heart Disease Father     ??? No Known Problems Daughter    ??? No Known Problems Son    ??? No Known Problems Sister    ??? Elevated Lipids Brother    ??? Elevated Lipids Brother        Social History     Social History   ??? Marital status: MARRIED     Spouse name: Shanon Brow   ??? Number of children: 2   ??? Years of education: N/A     Occupational History   ??? business owner-insurance Self Employed     Social History Main Topics   ??? Smoking status: Current Every Day Smoker     Packs/day: 0.25     Years: 30.00     Types: Cigarettes   ??? Smokeless tobacco: Never Used      Comment: 5-7 cigarettes per day.   ??? Alcohol use 0.6 oz/week     1 Standard drinks or equivalent per week      Comment: social   ??? Drug use: No   ??? Sexual activity: Yes     Partners: Male     Birth control/ protection: None     Other Topics Concern   ??? Not on file     Social History Narrative       Immunization History   Administered Date(s) Administered   ??? Influenza Vaccine (Quad) 01/05/2015   ??? Influenza Vaccine PF 02/24/2013   ??? Tdap 01/05/2015       Patient Care Team:  Domingo Mend, MD as PCP - General (Internal Medicine)  Desma Mcgregor, MD (Orthopedic Surgery)  Everardo Pacific, NP  Lujean Amel, MD (Gastroenterology)  Comer Locket, MD (Ophthalmology)      Objective:   Vitals:          Vitals:    01/14/17 0922   BP: 100/70   Pulse: 65   Resp: 14   Temp: 99 ??F (37.2 ??C)   TempSrc: Oral   SpO2: 98%   Weight: 144 lb (65.3 kg)   Height: '5\' 3"'  (1.6 m)       PHYSICAL EXAM:    General:    Alert, cooperative, no distress, appears stated age.     HEENT: Atraumatic, anicteric sclerae, pink conjunctivae     No  oral ulcers, mucosa moist, throat clear  Neck:  Supple, symmetrical,  thyroid: non tender  Back:    No CVA tenderness.  Lungs:   Clear to auscultation bilaterally.  No Wheezing or Rhonchi. No rales.  Chest wall:  No tenderness  No Accessory muscle use.  Heart:   Regular  rhythm,  No  murmur   No gallop.  No edema  Abdomen:   Soft, non-tender. Not distended.  Bowel sounds normal. No masses   Extremities: No cyanosis.  No clubbing No edema  Skin:     Not pale Not Jaundiced  No rashes   Lymph nodes: Cervical, supraclavicular normal.  Psych:  Good insight.  Not depressed.  Not anxious or agitated.  Neurologic: EOMs intact. No facial asymmetry. No aphasia or slurred speech. Normal   strength, Alert and oriented X 3.   Breasts: breasts appear normal, no suspicious masses, no skin or nipple changes or axillary nodes.    Lab Data Reviewed:   Orders Only on 12/30/2016   Component Date Value Ref Range Status   ??? HCV RATIO 12/30/2016 0.0  <1.0 N/A Final   ??? Hepatitis C Ab 12/30/2016 Non Reactive  Non Reactive Final   Orders Only on 12/30/2016   Component Date Value Ref Range Status   ??? Color 12/30/2016 Yellow  Yellow Final   ??? Appearance 12/30/2016 Clear  Clear Final   ??? Specific gravity 12/30/2016 1.020  1.001 - 1.035 N/A Final   ??? pH (UA) 12/30/2016 5.0  5.0 - 8.0 N/A Final   ??? Glucose 12/30/2016 Negative  Negative Final   ??? Bilirubin 12/30/2016 Negative  Negative Final   ??? Ketone 12/30/2016 Negative  Negative Final   ??? Blood 12/30/2016 Trace* Negative Final   ??? Protein 12/30/2016 Negative  Negative Final   ??? Nitrites 12/30/2016 Negative  Negative Final   ??? Leukocyte Esterase 12/30/2016 Negative  Negative Final   ??? WBC 12/30/2016 None Seen  <or=5  /hpf Final   ??? RBC 12/30/2016 None Seen  <or=2  /hpf Final   ??? SQUAMOUS EPITHELIAL CELLS 12/30/2016 None Seen  <or=5  /hpf Final   ??? Bacteria 12/30/2016 None Seen  None Seen  /hpf Final   ??? Hyaline cast 12/30/2016 None Seen  None Seen  /lpf Final   Orders Only on 12/30/2016   Component Date Value Ref Range Status   ??? Ferritin 12/30/2016 78  10 - 232 ng/mL Final   Orders Only on 12/30/2016   Component Date Value Ref Range Status   ??? TSH 12/30/2016 1.33  0.40 - 4.50 mIU/L Final   Orders Only on 12/13/2016   Component Date Value Ref Range Status   ??? WBC 12/30/2016 6.0  3.8 - 10.8 Thous/mcL Final   ??? RBC 12/30/2016 4.39  3.80 - 5.10 Mill/mcL Final    ??? HGB 12/30/2016 13.9  11.7 - 15.5 g/dL Final   ??? HCT 12/30/2016 41.9  35.0 - 45.0 % Final   ??? MCV 12/30/2016 95.6  80.0 - 100.0 fL Final   ??? MCH 12/30/2016 31.8  27.0 - 33.0 pg Final   ??? MCHC 12/30/2016 33.3  32.0 - 36.0 g/dL Final   ??? RDW 12/30/2016 14.0  11.0 - 15.0 % Final   ??? PLATELET 12/30/2016 211  140 - 400 Thous/mcL Final   ??? MEAN PLATELET VOLUME 12/30/2016 9.6  7.5 - 12.5 fL Final   ??? Neutrophils 12/30/2016 64.8  38 - 80 % Final   ??? LYMPHOCYTES 12/30/2016 28.7  15 - 49 % Final   ??? MONOCYTES 12/30/2016 4.8  0 - 13 % Final   ??? EOSINOPHILS 12/30/2016 1.3  0 - 8 % Final   ??? BASOPHILS 12/30/2016 0.4  0 - 2 % Final   ??? ABS. NEUTROPHILS 12/30/2016 3888  1500 - 7800 Cells/mcL Final   ??? ABS. LYMPHOCYTES 12/30/2016 1722  850 - 3900 Cells/mcL Final   ??? ABS. MONOCYTES 12/30/2016 288  200 - 950 Cells/mcL Final   ??? ABS. EOSINOPHILS 12/30/2016 78  15 - 500 Cells/mcL Final   ??? ABS. BASOPHILS 12/30/2016 24  0 - 200 Cells/mcL Final   ??? Differential 12/30/2016    Final             An instrument differential was performed.   ??? Cholesterol, total 12/30/2016 225* <200 mg/dL Final   ??? HDL Cholesterol 12/30/2016 60  >50 mg/dL Final   ??? Cholesterol/HDL ratio 12/30/2016 3.8  <5.0 calc Final   ??? LDL CHOL, CALCULATED 12/30/2016 147* <100 mg/dL Final    Comment:              LDL-C is now calculated using the Martin-Hopkins calculation,           which is a validated novel method providing better accuracy           than the Friedewald equation in the estimation of LDL-C.           Cresenciano Genre et al. Annamaria Helling. 2013; 310(19): 2061-2068               For additional information, please refer to           http://education.[facility].com/faq/FAQ164           (This link is being provided for informational/educational           purposes only.)               Desirable range < 100 mg/dL for primary prevention; <70 mg/dL           for patients with CHD or diabetic patients with > or = 2 CHD           risk factors.      ??? Triglyceride 12/30/2016 81  <150 mg/dL Final   ??? Non-HDL Cholesterol 12/30/2016 165* <130 mg/dL (calc) Final    Comment:              For patients with diabetes plus 1 major ASCVD risk           factor, treating to a non-HDL-C goal of <100 mg/dL           (LDL-C of <70 mg/dL) is considered a therapeutic           option.     ??? Glucose 12/30/2016 92  65 - 99 mg/dL Final   ??? Sodium 12/30/2016 142  135 - 146 mmol/L Final   ??? Potassium 12/30/2016 4.4  3.5 - 5.3 mmol/L Final   ??? Chloride 12/30/2016 107  98 - 110 mmol/L Final   ??? CO2 12/30/2016 28  20 - 32 mmol/L Final   ??? BUN 12/30/2016 17  7 - 25 mg/dL Final   ??? Creatinine 12/30/2016 0.78  0.50 - 1.05 mg/dL Final    Comment:          The upper reference limit for Creatinine is approximately           13% higher for people identified as African-American.     ???  BUN/Creatinine ratio 12/30/2016 NOTE  6 - 22 (calc) Final    Comment:          Bun/Creatinine ratio is not reported when the Bun and           Creatinine values are within normal limits.     ??? Calcium 12/30/2016 9.4  8.6 - 10.4 mg/dL Final   ??? Protein, total 12/30/2016 6.0* 6.1 - 8.1 g/dL Final   ??? Albumin 12/30/2016 4.1  3.6 - 5.1 g/dL Final   ??? Globulin 12/30/2016 1.9  1.9 - 3.7 g/dL (calc) Final   ??? ALB/GLOBRATIO 12/30/2016 2.2  1.0 - 2.5 (calc) Final   ??? Bilirubin, total 12/30/2016 0.4  0.2 - 1.2 mg/dL Final   ??? Alk. phosphatase 12/30/2016 62  33 - 130 U/L Final   ??? AST (SGOT) 12/30/2016 14  10 - 35 U/L Final   ??? ALT (SGPT) 12/30/2016 14  6 - 29 U/L Final   ??? EGFR NON AFR AMERICAN 12/30/2016 87  >=60 mL/min/1.14m Final   ??? GFR est AA 12/30/2016 101  >=60 mL/min/1.712mFinal   ??? Vitamin B12 12/30/2016 1077  200 - 1100 pg/mL Final   ??? VITAMIN D, 25-HYDROXY 12/30/2016 36  30 - 100 ng/mL Final    Comment:              Vitamin D Status         25-OH Vitamin D:                Deficiency:                    <20 ng/mL           Insufficiency:             20 - 29 ng/mL            Optimal:                 > or = 30 ng/mL               For 25-OH Vitamin D testing on patients on           D2-supplementation and patients for whom quantitation           of D2 and D3 fractions is required, the [facility]TM)           25-OH VIT D, (D2,D3), LC/MS/MS is recommended: order           code [id number] >[id number]               For more information on this test, go to:           http://education.[facility].com/faq/FAQ163            EKG No results found for any visits on 01/14/17.      Health Maintenance   Topic Date Due   ??? Pneumococcal 19-64 Medium Risk (1 of 1 - PPSV23) 02/23/1983   ??? Shingrix Vaccine Age 53> (1 of 2) 02/22/2014   ??? Influenza Age 36 t52 Adult  11/12/2016   ??? BREAST CANCER SCRN MAMMOGRAM  07/02/2017   ??? PAP AKA CERVICAL CYTOLOGY  09/14/2017   ??? COLONOSCOPY  03/20/2020   ??? DTaP/Tdap/Td series (2 - Td) 01/04/2025   ??? Hepatitis C Screening  Completed         Assessment/Plan:   1. Well woman exam (no gynecological exam)  Healthy [age]ar old female.  Reviewed labs.  Immunizations given.  Encouraged weight loss through diet and exercise and smoking cessation    2. Encounter for immunization     - Influenza vaccine (QUADRIVALENT VIAL)  IM (51025)  - Pneumococcal Polysaccharide vaccine, 23-Valent, Adult or Immunocompromised    3. Current every day smoker  The patient was counseled on the dangers of tobacco use, and was advised to quit.  Reviewed strategies to maximize success, including removing cigarettes and smoking materials from environment, stress management, substitution of other forms of reinforcement and support of family/friends.          The patient is advised to quit smoking, begin progressive daily aerobic exercise program, follow a low fat, low cholesterol diet, attempt to lose weight, improve dietary compliance, continue current medications, continue current healthy lifestyle patterns and return for routine annual checkups.   Discussed the patient's I have reviewed/discussed the above normal BMI with the patient.  I have recommended the following interventions: dietary management education, guidance, and counseling and encourage exercise .       Care Plan discussed with:   '[x]' Patient   '[]' Family    '[]' Care Manager    '[]' Nursing   '[]' Consultant/Specialist :   '[x]'   >50% of visit spent in counseling and coordination of care       Discussed     '[]' Advanced Directives,  '[x]'  smoking cessation   '[x]'  diet   '[x]' excercise   '[]' weight loss recommended  '[]' seat belts  '[]' sun screen usage  '[]' safe sex practices  [  ] texting while driving  [  ]cell phone use while driving[x ] medication usage, compliance, and side effects          Follow up in Follow-up Disposition:  Return in about 1 year (around 01/14/2018).          Note: This report was transcribed using voice recognition software and is thus   prone to syntax and contextual spelling errors. Please do not hesitate to call   me if anything needs clarification or a dictated addendum         ___________________________________________________     Domingo Mend, MD     ___________________________________________________

## 2017-01-14 NOTE — Patient Instructions (Addendum)
Shingrix  The new shingles shot is recombinant vaccine given in 2 doses.  Doses must be separated by 2-6 months.  You can expect a local reaction at the site of the injection which is common but not an allergy.  You may also expect to feel some flulike symptoms for 1-2 days afterwards.  Again this is not an allergic reaction but an expected reaction from the injection.    Studies have shown that this vaccine series will give you more long-lasting immunity to prevent a shingles infection which can be quite painful.  The pain of the shingles infection can last beyond the duration of the rash.    Current recommendations recommend that even if you had the previous Zostavax that you also have the new Shingrix vaccine as this Zostavax immunity will decrease over time.    The previous Zostavax was a live vaccine and could not be given to immunocompromised patients or 2 people who would be around on immunized contacts such as young grandchildren.  The new vaccine is recombinant vaccine which can be given to anyone.  Current recommendations are that it can be given to anyone age 50 and above.    Insurance coverage for this vaccine is varied.  Please check your insurance including your prescription plan to see whether it is better covered in our office or at the pharmacy.    The vaccine can be given in either office, [place] or [place], or you can receive it at the pharmacy based upon which location would cost you less with your insurance.      Vaccine Information Statement    Influenza (Flu) Vaccine (Inactivated or Recombinant): What you need to know    Many Vaccine Information Statements are available in Spanish and other languages. See www.immunize.org/vis  Hojas de Informaci??n Sobre Vacunas est??n disponibles en Espa??ol y en muchos otros idiomas. Visite www.immunize.org/vis    1. Why get vaccinated?    Influenza (???flu???) is a contagious disease that spreads around the [place] every year, usually between October and May.     Flu is caused by influenza viruses, and is spread mainly by coughing, sneezing, and close contact.     Anyone can get flu. Flu strikes suddenly and can last several days. Symptoms vary by age, but can include:  ??? fever/chills  ??? sore throat  ??? muscle aches  ??? fatigue  ??? cough  ??? headache   ??? runny or stuffy nose    Flu can also lead to pneumonia and blood infections, and cause diarrhea and seizures in children.  If you have a medical condition, such as heart or lung disease, flu can make it worse.    Flu is more dangerous for some people. Infants and young children, people 65 years of age and older, pregnant women, and people with certain health conditions or a weakened immune system are at greatest risk.      Each year thousands of people in the [place] die from flu, and many more are hospitalized.     Flu vaccine can:  ??? keep you from getting flu,  ??? make flu less severe if you do get it, and  ??? keep you from spreading flu to your family and other people.     2. Inactivated and recombinant flu vaccines    A dose of flu vaccine is recommended every flu season. Children 6 months through 8 years of age may need two doses during the same flu season.    Everyone else needs only one dose each flu season.       Some inactivated flu vaccines contain a very small amount of a mercury-based preservative called thimerosal. Studies have not shown thimerosal in vaccines to be harmful, but flu vaccines that do not contain thimerosal are available.    There is no live flu virus in flu shots.  They cannot cause the flu.     There are many flu viruses, and they are always changing. Each year a new flu vaccine is made to protect against three or four viruses that are likely to cause disease in the upcoming flu season. But even when the vaccine doesn???t exactly match these viruses, it may still provide some protection    Flu vaccine cannot prevent:   ??? flu that is caused by a virus not covered by the vaccine, or  ??? illnesses that look like flu but are not.    It takes about 2 weeks for protection to develop after vaccination, and protection lasts through the flu season.     3. Some people should not get this vaccine    Tell the person who is giving you the vaccine:    ??? If you have any severe, life-threatening allergies.    If you ever had a life-threatening allergic reaction after a dose of flu vaccine, or have a severe allergy to any part of this vaccine, you may be advised not to get vaccinated.  Most, but not all, types of flu vaccine contain a small amount of egg protein.       ??? If you ever had Guillain-Barr?? Syndrome (also called GBS).   Some people with a history of GBS should not get this vaccine. This should be discussed with your doctor.    ??? If you are not feeling well.    It is usually okay to get flu vaccine when you have a mild illness, but you might be asked to come back when you feel better.      4. Risks of a vaccine reaction    With any medicine, including vaccines, there is a chance of reactions. These are usually mild and go away on their own, but serious reactions are also possible.     Most people who get a flu shot do not have any problems with it.     Minor problems following a flu shot include:   ??? soreness, redness, or swelling where the shot was given    ??? hoarseness  ??? sore, red or itchy eyes  ??? cough  ??? fever  ??? aches  ??? headache  ??? itching  ??? fatigue  If these problems occur, they usually begin soon after the shot and last 1 or 2 days.     More serious problems following a flu shot can include the following:    ??? There may be a small increased risk of Guillain-Barr?? Syndrome (GBS) after inactivated flu vaccine.  This risk has been estimated at 1 or 2 additional cases per million people vaccinated. This is much lower than the risk of severe complications from flu, which can be prevented by flu vaccine.       ??? Young children who get the flu shot along with pneumococcal vaccine (PCV13) and/or DTaP vaccine at the same time might be slightly more likely to have a seizure caused by fever. Ask your doctor for more information. Tell your doctor if a child who is getting flu vaccine has ever had a seizure.  Problems that could happen after any injected vaccine:     ??? People sometimes faint after a medical procedure, including vaccination. Sitting or lying down for about 15 minutes can help prevent fainting, and injuries caused by a fall. Tell your doctor if you feel dizzy, or have vision changes or ringing in the ears.    ??? Some people get severe pain in the shoulder and have difficulty moving the arm where a shot was given. This happens very rarely.    ??? Any medication can cause a severe allergic reaction. Such reactions from a vaccine are very rare, estimated at about 1 in a million doses, and would happen within a few minutes to a few hours after the vaccination.    As with any medicine, there is a very remote chance of a vaccine causing a serious injury or death.    The safety of vaccines is always being monitored. For more information, visit: www.cdc.gov/vaccinesafety/    5. What if there is a serious reaction?    What should I look for?    ??? Look for anything that concerns you, such as signs of a severe allergic reaction, very high fever, or unusual behavior.    Signs of a severe allergic reaction can include hives, swelling of the face and throat, difficulty breathing, a fast heartbeat, dizziness, and weakness ??? usually within a few minutes to a few hours after the vaccination.    What should I do?    ??? If you think it is a severe allergic reaction or other emergency that can???t wait, call 9-1-1 and get the person to the nearest hospital. Otherwise, call your doctor.    ??? Reactions should be reported to the Vaccine Adverse Event Reporting System (VAERS). Your doctor should file this report, or you can do it  yourself through  the VAERS web site at www.vaers.hhs.gov, or by calling 1-800-822-7967.    VAERS does not give medical advice.    6. The National Vaccine Injury Compensation Program    The National Vaccine Injury Compensation Program (VICP) is a federal program that was created to compensate people who may have been injured by certain vaccines.    Persons who believe they may have been injured by a vaccine can learn about the program and about filing a claim by calling 1-800-338-2382 or visiting the VICP website at www.hrsa.gov/vaccinecompensation.  There is a time limit to file a claim for compensation.    7. How can I learn more?  ??? Ask your healthcare provider. He or she can give you the vaccine package insert or suggest other sources of information.  ??? Call your local or state health department.  ??? Contact the Centers for Disease Control and Prevention (CDC):  - Call 1-800-232-4636 (1-800-CDC-INFO) or  - Visit CDC???s website at www.cdc.gov/flu    Vaccine Information Statement   Inactivated Influenza Vaccine   11/18/2013  [place] ?? [id number]    Department of Health and Human Services  Centers for Disease Control and Prevention    Office Use Only    Vaccine Information Statement    Pneumococcal Polysaccharide Vaccine: What You Need to Know    Many Vaccine Information Statements are available in Spanish and other languages. See www.immunize.org/vis.  Hojas de informaci??n Sobre Vacunas est??n disponibles en espa??ol y en muchos otros idiomas. Visite http://www.immunize.org/vis.    1. Why get vaccinated?    Vaccination can protect older adults (and some children and younger adults) from pneumococcal disease.      Pneumococcal disease is caused by bacteria that can spread from person to person through close contact.  It can cause ear infections, and it can also lead to more serious infections of the:  ??? Lungs (pneumonia),  ??? Blood (bacteremia), and  ??? Covering of the brain and spinal cord (meningitis). Meningitis can cause  deafness and brain damage, and it can be fatal.      Anyone can get pneumococcal disease, but children under 2 years of age, people with certain medical conditions, adults over 65 years of age, and cigarette smokers are at the highest risk.    About 18,000 older adults die each year from pneumococcal disease in the [place].    Treatment of pneumococcal infections with penicillin and other drugs used to be more effective. But some strains of the disease have become resistant to these drugs. This makes prevention of the disease, through vaccination, even more important.    2. Pneumococcal polysaccharide vaccine (PPSV23)    Pneumococcal polysaccharide vaccine (PPSV23) protects against 23 types of pneumococcal bacteria. It will not prevent all pneumococcal disease.    PPSV23 is recommended for:  ??? All adults 65 years of age and older,  ??? Anyone 2 through 53 years of age with certain long-term health problems,  ??? Anyone 2 through 53 years of age with a weakened immune system,  ??? Adults 19 through 53 years of age who smoke cigarettes or have asthma.     Most people need only one dose of PPSV.  A second dose is recommended for certain high-risk groups.  People 65 and older should get a dose even if they have gotten one or more doses of the vaccine before they turned 65.    Your healthcare provider can give you more information about these recommendations.    Most healthy adults develop protection within 2 to 3 weeks of getting the shot.     3. Some people should not get this vaccine    ??? Anyone who has had a life-threatening allergic reaction to PPSV should not get another dose.    ??? Anyone who has a severe allergy to any component of PPSV should not receive it. Tell your provider if you have any severe allergies.    ??? Anyone who is moderately or severely ill when the shot is scheduled may be asked to wait until they recover before getting the vaccine. Someone with a mild illness can usually be vaccinated.     ??? Children less than 2 years of age should not receive this vaccine.    ??? There is no evidence that PPSV is harmful to either a pregnant woman or to her fetus. However, as a precaution, women who need the vaccine should be vaccinated before becoming pregnant, if possible.    4. Risks of a vaccine reaction    With any medicine, including vaccines, there is a chance of side effects. These are usually mild and go away on their own, but serious reactions are also possible.     About half of people who get PPSV have mild side effects, such as redness or pain where the shot is given, which go away within about two days.    Less than 1 out of 100 people develop a fever, muscle aches, or more severe local reactions.    Problems that could happen after any vaccine:    ??? People sometimes faint after a medical procedure, including vaccination. Sitting or lying down for about 15 minutes can   help prevent fainting, and injuries caused by a fall. Tell your doctor if you feel dizzy, or have vision changes or ringing in the ears.    ??? Some people get severe pain in the shoulder and have difficulty moving the arm where a shot was given. This happens very rarely.    ??? Any medication can cause a severe allergic reaction. Such reactions from a vaccine are very rare, estimated at about 1 in a million doses, and would happen within a few minutes to a few hours after the vaccination.     As with any medicine, there is a very remote chance of a vaccine causing a serious injury or death.    The safety of vaccines is always being monitored.  For more information, visit: www.cdc.gov/vaccinesafety/     5. What if there is a serious reaction?    What should I look for?    Look for anything that concerns you, such as signs of a severe allergic reaction, very high fever, or unusual behavior.    Signs of a severe allergic reaction can include hives, swelling of the face and throat, difficulty breathing, a fast heartbeat, dizziness, and  weakness. These would usually start a few minutes to a few hours after the vaccination.    What should I do?    If you think it is a severe allergic reaction or other emergency that can???t wait, call 9-1-1 or get to the nearest hospital. Otherwise, call your doctor.    Afterward, the reaction should be reported to the Vaccine Adverse Event Reporting System (VAERS). Your doctor might file this report, or you can do it yourself through the VAERS web site at www.vaers.hhs.gov, or by calling 1-800-822-7967.     VAERS does not give medical advice.    6. How can I learn more?    ??? Ask your doctor. He or she can give you the vaccine package insert or suggest other sources of information.  ??? Call your local or state health department.  ??? Contact the Centers for Disease Control and Prevention (CDC):  - Call 1-800-232-4636 (1-800-CDC-INFO) or  - Visit CDC???s website at www.cdc.gov/vaccines    Vaccine Information Statement   PPSV   08/05/2013    Department of Health and Human Services  Centers for Disease Control and Prevention    Office Use Only

## 2017-02-11 NOTE — Progress Notes (Signed)
Pt advised. Pt have appt with MD on 01/28/18

## 2017-02-11 NOTE — Progress Notes (Signed)
mammography is normal.  Repeat mammography in one year

## 2017-10-12 ENCOUNTER — Encounter

## 2017-10-12 ENCOUNTER — Ambulatory Visit
Admit: 2017-10-12 | Discharge: 2017-10-12 | Payer: PRIVATE HEALTH INSURANCE | Attending: Nurse Practitioner | Primary: Internal Medicine

## 2017-10-12 ENCOUNTER — Ambulatory Visit: Attending: Nurse Practitioner | Primary: Internal Medicine

## 2017-10-12 DIAGNOSIS — R8290 Unspecified abnormal findings in urine: Secondary | ICD-10-CM

## 2017-10-12 LAB — AMB POC URINE DIP STICK MANUAL W/O MICRO CHEMSTRIP-10
Bilirubin (UA POC): NEGATIVE
Glucose (UA POC): NEGATIVE mg/dL
Ketones (UA POC): NEGATIVE
Nitrites (UA POC): NEGATIVE
Specific gravity (UA POC): 1.01 (ref 1.001–1.030)
Urobilinogen (UA POC): NORMAL mg/dL (ref ?–1)
pH (UA POC): 5 (ref 5–9)

## 2017-10-12 MED ORDER — NITROFURANTOIN (25% MACROCRYSTAL FORM) 100 MG CAP
100 mg | ORAL_CAPSULE | Freq: Two times a day (BID) | ORAL | 0 refills | Status: DC
Start: 2017-10-12 — End: 2017-10-16

## 2017-10-12 MED ORDER — DICLOFENAC 50 MG TAB, DELAYED RELEASE
50 mg | ORAL_TABLET | Freq: Two times a day (BID) | ORAL | 1 refills | Status: DC
Start: 2017-10-12 — End: 2018-06-28

## 2017-10-12 NOTE — Progress Notes (Signed)
I reviewed the patient's medical history, the nurse practitioner's findings on physical examination, the patient's diagnoses, and treatment plan as documented in the progress note. I concur with the treatment plan as documented. There are no additional recommendations at this time.    Timonthy Hovater A Maynard David, MD

## 2017-10-12 NOTE — Progress Notes (Signed)
Pt advised, verbalized understanding.   Pt would like to discuss the antibiotic that was prescribed because it is not working. Please advised.

## 2017-10-12 NOTE — Progress Notes (Signed)
SUBJECTIVE: Lindsay Liu is a 52 y.o. female who complains of urinary frequency, urgency and dysuria x 7 days, without flank pain, fever, chills, or abnormal vaginal discharge or bleeding. No rash.  Took pyridium this AM.      She also would like refill on voltaren BID.  For mid upper back arthritis/ impingement usually has refill that lasts for a whole year.  Mom dx with recurrence of MM 06/2017.  Very stressed out.      Past Medical History Reviewed    Allergies   Allergen Reactions   ??? Levaquin [Levofloxacin] Shortness of Breath     Cipro in the past without adverse effect   ??? Penicillin G Shortness of Breath     Prior to Admission medications    Medication Sig Start Date End Date Taking? Authorizing Provider   omega-3s/dha/epa/fish oil (OMEGA 3 PO) Take  by mouth. Indications: 10,000 mg   Yes Provider, Historical   nitrofurantoin, macrocrystal-monohydrate, (MACROBID) 100 mg capsule Take 1 Cap by mouth two (2) times a day for 5 days. 10/12/17 10/17/17 Yes Leonard SchwartzMiller, Haislee Corso, NP   diclofenac EC (VOLTAREN) 50 mg EC tablet Take 1 Tab by mouth two (2) times a day. Indications: joint damage causing pain and loss of function 10/12/17  Yes Leonard SchwartzMiller, Dainel Arcidiacono, NP   Lactobacillus acidophilus (PROBIOTIC PO) Take  by mouth daily.   Yes Provider, Historical   cetirizine HCl (ZYRTEC PO) Take  by mouth as needed.   Yes Provider, Historical   cholecalciferol, vitamin D3, (VITAMIN D3) 2,000 unit tab Take 2,000 Units by mouth daily.   Yes Provider, Historical   cyanocobalamin 1,000 mcg tablet Take 1,000 mcg by mouth daily.   Yes Provider, Historical       OBJECTIVE:   Visit Vitals  BP 101/73   Pulse 85   Temp 98.7 ??F (37.1 ??C) (Oral)   Resp 14   Ht 5\' 3"  (1.6 m)   Wt 152 lb (68.9 kg)   SpO2 95%   BMI 26.93 kg/m??       Appears well, in no apparent distress.  Vital signs are normal. Alert and Oriented x 3, CV: RRR.  Lungs clear to auscultation.  The abdomen is soft without tenderness, guarding, mass, rebound or organomegaly. No CVA tenderness or  inguinal adenopathy noted.     Urine dipstick shows:     Results for orders placed or performed in visit on 10/12/17   AMB POC URINE DIP STICK MANUAL W/O MICRO CHEMSTRIP-10   Result Value Ref Range    Color (UA POC) Yellow     Clarity (UA POC) Clear     Specific gravity (UA POC) 1.010 1.001 - 1.030    pH (UA POC) 5 5 - 9    Leukocyte esterase (UA POC) 1+ Negative    Nitrites (UA POC) Negative Negative    Protein (UA POC) Trace Negative    Glucose (UA POC) Negative Negative mg/dL    Ketones (UA POC) Negative Negative    Urobilinogen (UA POC) normal Normal - 1 mg/dL    Bilirubin (UA POC) Negative Negative    Blood (UA POC) Trace Negative ery/uL     Urine culture pending.          ASSESSMENT & PLAN:   Diagnoses and all orders for this visit:    1. Abnormal urine  -     CULTURE, URINE; Future  -     AMB POC URINE DIP STICK MANUAL W/O MICRO CHEMSTRIP-10  -  URINALYSIS W/ RFLX MICROSCOPIC; Future  -     nitrofurantoin, macrocrystal-monohydrate, (MACROBID) 100 mg capsule; Take 1 Cap by mouth two (2) times a day for 5 days.    2. Arthritis  -     diclofenac EC (VOLTAREN) 50 mg EC tablet; Take 1 Tab by mouth two (2) times a day. Indications: joint damage causing pain and loss of function      Pyridium likely gave inaccurate urine dip    Cystitis with high suspicion UTI uncomplicated without evidence of pyelonephritis  Treatment per orders - also push fluids, may use Pyridium OTC prn. Call or return to clinic prn if these symptoms worsen or fail to improve as anticipated.    Hydrate  Cranberry tablets  Avoid acidic foods  Avoid Caffeine  Avoid fake sugar  Perineal hygiene   Call with worsening symptoms    Macrobid 2x/day x 5 days   Probiotic daily      Care Plan discussed with:    [x] Patient   [] Family    [] Care Manager    [] Nursing   [] Consultant/Specialist :  re   (Discussed [] CODE status,  [] Care Plan, [] D/C Planning)      [x]   >50% of visit spent in counseling and coordination of care      Total time spent with  patient:  25   minutes      Leonard Schwartz, NP  10/12/2017    Patient Care Team:  Pricilla Handler, MD as PCP - General (Internal Medicine)  Oh, Adelene Amas, MD (Orthopedic Surgery)  Lita Mains, NP  Markus Daft, MD (Gastroenterology)  Chana Bode, MD (Ophthalmology)

## 2017-10-12 NOTE — Patient Instructions (Signed)
Hydrate  Cranberry tablets  Avoid acidic foods  Avoid Caffeine  Avoid fake sugar  Perineal hygiene   Call with worsening symptoms    Macrobid 2x/day x 5 days   Probiotic daily

## 2017-10-12 NOTE — Progress Notes (Signed)
+  UTI finish macrobid

## 2017-10-12 NOTE — Progress Notes (Signed)
SUBJECTIVE: Lindsay Liu is a 52 y.o. female who complains of urinary frequency, urgency and dysuria x 7 days, without flank pain, fever, chills, or abnormal vaginal discharge or bleeding. No rash.  Took pyridium this AM.      She also would like refill on voltaren BID.  For mid upper back arthritis/ impingement usually has refill that lasts for a whole year.  Mom dx with recurrence of MM 06/2017.  Very stressed out.      Past Medical History Reviewed    Allergies   Allergen Reactions   ??? Levaquin [Levofloxacin] Shortness of Breath     Cipro in the past without adverse effect   ??? Penicillin G Shortness of Breath     Prior to Admission medications    Medication Sig Start Date End Date Taking? Authorizing Provider   omega-3s/dha/epa/fish oil (OMEGA 3 PO) Take  by mouth. Indications: 10,000 mg   Yes Provider, Historical   nitrofurantoin, macrocrystal-monohydrate, (MACROBID) 100 mg capsule Take 1 Cap by mouth two (2) times a day for 5 days. 10/12/17 10/17/17 Yes Leonard SchwartzMiller, Verner Kopischke, NP   diclofenac EC (VOLTAREN) 50 mg EC tablet Take 1 Tab by mouth two (2) times a day. Indications: joint damage causing pain and loss of function 10/12/17  Yes Leonard SchwartzMiller, Kolbey Teichert, NP   Lactobacillus acidophilus (PROBIOTIC PO) Take  by mouth daily.   Yes Provider, Historical   cetirizine HCl (ZYRTEC PO) Take  by mouth as needed.   Yes Provider, Historical   cholecalciferol, vitamin D3, (VITAMIN D3) 2,000 unit tab Take 2,000 Units by mouth daily.   Yes Provider, Historical   cyanocobalamin 1,000 mcg tablet Take 1,000 mcg by mouth daily.   Yes Provider, Historical       OBJECTIVE:   Visit Vitals  BP 101/73   Pulse 85   Temp 98.7 ??F (37.1 ??C) (Oral)   Resp 14   Ht 5\' 3"  (1.6 m)   Wt 152 lb (68.9 kg)   SpO2 95%   BMI 26.93 kg/m??       Appears well, in no apparent distress.  Vital signs are normal. Alert and Oriented x 3, CV: RRR.  Lungs clear to auscultation.  The abdomen is soft without tenderness, guarding, mass, rebound or organomegaly. No CVA  tenderness or inguinal adenopathy noted.     Urine dipstick shows:     Results for orders placed or performed in visit on 10/12/17   AMB POC URINE DIP STICK MANUAL W/O MICRO CHEMSTRIP-10   Result Value Ref Range    Color (UA POC) Yellow     Clarity (UA POC) Clear     Specific gravity (UA POC) 1.010 1.001 - 1.030    pH (UA POC) 5 5 - 9    Leukocyte esterase (UA POC) 1+ Negative    Nitrites (UA POC) Negative Negative    Protein (UA POC) Trace Negative    Glucose (UA POC) Negative Negative mg/dL    Ketones (UA POC) Negative Negative    Urobilinogen (UA POC) normal Normal - 1 mg/dL    Bilirubin (UA POC) Negative Negative    Blood (UA POC) Trace Negative ery/uL     Urine culture pending.          ASSESSMENT & PLAN:   Diagnoses and all orders for this visit:    1. Abnormal urine  -     CULTURE, URINE; Future  -     AMB POC URINE DIP STICK MANUAL W/O MICRO CHEMSTRIP-10  -  URINALYSIS W/ RFLX MICROSCOPIC; Future  -     nitrofurantoin, macrocrystal-monohydrate, (MACROBID) 100 mg capsule; Take 1 Cap by mouth two (2) times a day for 5 days.    2. Arthritis  -     diclofenac EC (VOLTAREN) 50 mg EC tablet; Take 1 Tab by mouth two (2) times a day. Indications: joint damage causing pain and loss of function      Pyridium likely gave inaccurate urine dip    Cystitis with high suspicion UTI uncomplicated without evidence of pyelonephritis  Treatment per orders - also push fluids, may use Pyridium OTC prn. Call or return to clinic prn if these symptoms worsen or fail to improve as anticipated.    Hydrate  Cranberry tablets  Avoid acidic foods  Avoid Caffeine  Avoid fake sugar  Perineal hygiene   Call with worsening symptoms    Macrobid 2x/day x 5 days   Probiotic daily      Care Plan discussed with:    [x] Patient   [] Family    [] Care Manager    [] Nursing   [] Consultant/Specialist :  re   (Discussed [] CODE status,  [] Care Plan, [] D/C Planning)      [x]   >50% of visit spent in counseling and coordination of care       Total time spent with patient:  25   minutes      Leonard Schwartz, NP  10/12/2017    Patient Care Team:  Pricilla Handler, MD as PCP - General (Internal Medicine)  Oh, Adelene Amas, MD (Orthopedic Surgery)  Lita Mains, NP  Markus Daft, MD (Gastroenterology)  Chana Bode, MD (Ophthalmology)

## 2017-10-12 NOTE — Progress Notes (Signed)
I reviewed the patient's medical history, the nurse practitioner's findings on physical examination, the patient's diagnoses, and treatment plan as documented in the progress note. I concur with the treatment plan as documented. There are no additional recommendations at this time.    Zakariya Knickerbocker A Ellinor Test, MD

## 2017-10-12 NOTE — Progress Notes (Signed)
Pt advised, verbalized understanding.   Pt would like to discuss the antibiotic that was prescribed because it is not working. Please advised.

## 2017-10-13 NOTE — Progress Notes (Signed)
Change to cipro BID x 5 days

## 2017-10-13 NOTE — Progress Notes (Signed)
jen wants to know 250mg or 500mg

## 2017-10-13 NOTE — Progress Notes (Signed)
jen wants to know 250mg  or 500mg 

## 2017-10-13 NOTE — Progress Notes (Signed)
Change to cipro BID x 5 days

## 2017-10-15 LAB — SUSCEPTIBILITY
AMPICILLIN/SULBACTAM, 998134: <=2 — AB
AMPICILLIN: 4 — AB
Ampicillin: 4 — AB
CEFAZOLIN: <=4
CEFEPIME: <=1 — AB
CEFTAZIDIME: <=1 — AB
CEFTRIAXONE: <=1 — AB
CIPROFLOXACIN: <=0.25 — AB
Cefazolin: <=4
Cefepime: <=1 — AB
Ceftazidime: <=1 — AB
Ceftriaxone: <=1 — AB
Ciprofloxacin: <=0.25 — AB
ERTAPENEM: <=0.5 — AB
Ertapenem: <=0.5 — AB
GENTAMICIN: <=1 — AB
Gentamicin: <=1 — AB
IMIPENEM: <=0.25 — AB
Imipenem: <=0.25 — AB
LEVOFLOXACIN: <=0.12 — AB
Levofloxacin: <=0.12 — AB
NITROFURANTOIN: <=16 — AB
Nitrofurantoin: <=16 — AB
PIPERACILLIN/TAZOBACTAM: <=4 — AB
Piperacillin/Tazobactam: <=4 — AB
TRIMETHOPRIM/SULFAMETHOXAZOLE: <=20 — AB
Tobramycin: <=1 — AB
Tobramycin: <=1 — AB
Trimethoprim/Sulfamethoxazole: <=20 — AB
ampicillin/sulbactam: <=2 — AB

## 2017-10-15 LAB — CULTURE, URINE

## 2017-10-15 LAB — URINALYSIS W/ RFLX MICROSCOPIC
BACTERIA, URINE: NONE SEEN /hpf
Bacteria: NONE SEEN /hpf
Bilirubin, Urine: NEGATIVE
Bilirubin: NEGATIVE
Blood, Urine: NEGATIVE
Blood: NEGATIVE
Glucose, Ur: NEGATIVE
Glucose: NEGATIVE
Hyaline Casts, UA: NONE SEEN /lpf
Hyaline cast: NONE SEEN /lpf
Ketone: NEGATIVE
Ketones, Urine: NEGATIVE
Nitrite, Urine: NEGATIVE
Nitrites: NEGATIVE
Protein, UA: NEGATIVE
Protein: NEGATIVE
RBC, UA: NONE SEEN /hpf (ref <=2)
RBC: NONE SEEN /hpf (ref <=2)
SQUAMOUS EPITHELIAL CELLS: NONE SEEN /hpf (ref <=5)
Specific Gravity, UA: 1.008 (ref 1.001–1.035)
Specific gravity: 1.008 (ref 1.001–1.035)
Squamous Epithelial Cells: NONE SEEN /hpf (ref <=5)
pH (UA): 5.5 (ref 5.0–8.0)
pH, UA: 5.5 (ref 5.0–8.0)

## 2017-10-16 MED ORDER — CIPROFLOXACIN 250 MG TAB
250 mg | ORAL_TABLET | Freq: Two times a day (BID) | ORAL | 0 refills | Status: DC
Start: 2017-10-16 — End: 2018-01-28

## 2017-10-16 NOTE — Telephone Encounter (Signed)
Requested Prescriptions     Signed Prescriptions Disp Refills   ??? ciprofloxacin HCl (CIPRO) 250 mg tablet 10 Tab 0     Sig: Take 1 Tab by mouth every twelve (12) hours.     Authorizing Provider: MILLER, RACHEL J     Ordering User: QUINN, JENNIFER

## 2018-01-18 NOTE — Progress Notes (Signed)
Your labs are normal except your cholesterol profile has worsened.  We will discuss this further at your upcoming visit

## 2018-01-18 NOTE — Progress Notes (Signed)
Your labs are normal except your cholesterol profile has worsened.  We will discuss this further at your upcoming visit

## 2018-01-20 ENCOUNTER — Encounter: Attending: Internal Medicine | Primary: Internal Medicine

## 2018-01-20 LAB — CBC WITH AUTOMATED DIFF
ABS. BASOPHILS: 29 cells/uL (ref 0–200)
ABS. EOSINOPHILS: 63 cells/uL (ref 15–500)
ABS. LYMPHOCYTES: 2428 cells/uL (ref 850–3900)
ABS. MONOCYTES: 314 cells/uL (ref 200–950)
ABS. NEUTROPHILS: 2867 cells/uL (ref 1500–7800)
BASOPHILS: 0.5 % (ref 0–2)
EOSINOPHILS: 1.1 % (ref 0–8)
HCT: 43.3 % (ref 35.0–45.0)
HGB: 14.5 g/dL (ref 11.7–15.5)
LYMPHOCYTES: 42.6 % (ref 15–49)
MCH: 32.2 pg (ref 27.0–33.0)
MCHC: 33.5 g/dL (ref 32.0–36.0)
MCV: 96 fL (ref 80.0–100.0)
MEAN PLATELET VOLUME: 11.5 fL (ref 7.5–12.5)
MONOCYTES: 5.5 % (ref 0–13)
Neutrophils: 50.3 % (ref 38–80)
PLATELET: 209 10*3/uL (ref 140–400)
RBC: 4.51 10*6/uL (ref 3.80–5.10)
RDW: 12.4 % (ref 11.0–15.0)
WBC: 5.7 10*3/uL (ref 3.8–10.8)

## 2018-01-20 LAB — METABOLIC PANEL, COMPREHENSIVE
ALB/GLOBRATIO: 2 (calc) (ref 1.0–2.5)
ALT (SGPT): 15 U/L (ref 6–29)
AST (SGOT): 15 U/L (ref 10–35)
Albumin: 4.4 g/dL (ref 3.6–5.1)
Alk. phosphatase: 66 U/L (ref 33–130)
BUN: 13 mg/dL (ref 7–25)
Bilirubin, total: 0.4 mg/dL (ref 0.2–1.2)
CO2: 27 mmol/L (ref 20–32)
Calcium: 9.4 mg/dL (ref 8.6–10.4)
Chloride: 107 mmol/L (ref 98–110)
Creatinine: 0.79 mg/dL (ref 0.50–1.05)
EGFR NON AFR AMERICAN: 85 mL/min/{1.73_m2} (ref >=60)
GFR est AA: 99 mL/min/{1.73_m2} (ref >=60)
Globulin: 2.2 g/dL (calc) (ref 1.9–3.7)
Glucose: 90 mg/dL (ref 65–99)
Potassium: 4.2 mmol/L (ref 3.5–5.3)
Protein, total: 6.6 g/dL (ref 6.1–8.1)
Sodium: 142 mmol/L (ref 135–146)

## 2018-01-20 LAB — URINALYSIS W/ RFLX MICROSCOPIC
BACTERIA, URINE: NONE SEEN /hpf
Bacteria: NONE SEEN /hpf
Bilirubin, Urine: NEGATIVE
Bilirubin: NEGATIVE
Glucose, Ur: NEGATIVE
Glucose: NEGATIVE
Hyaline Casts, UA: NONE SEEN /lpf
Hyaline cast: NONE SEEN /lpf
Ketone: NEGATIVE
Ketones, Urine: NEGATIVE
Leukocyte Esterase, Urine: NEGATIVE
Leukocyte Esterase: NEGATIVE
Nitrite, Urine: NEGATIVE
Nitrites: NEGATIVE
Protein, UA: NEGATIVE
Protein: NEGATIVE
RBC, UA: NONE SEEN /hpf (ref <=2)
RBC: NONE SEEN /hpf (ref <=2)
SQUAMOUS EPITHELIAL CELLS: NONE SEEN /hpf (ref <=5)
Specific Gravity, UA: 1.018 (ref 1.001–1.035)
Specific gravity: 1.018 (ref 1.001–1.035)
Squamous Epithelial Cells: NONE SEEN /hpf (ref <=5)
WBC, UA: NONE SEEN /hpf (ref <=5)
WBC: NONE SEEN /hpf (ref <=5)
pH (UA): 5 (ref 5.0–8.0)
pH, UA: 5 (ref 5.0–8.0)

## 2018-01-20 LAB — LIPID PANEL
Chol/HDL Ratio: 4.4 calc (ref <5.0)
Cholesterol, Total: 238 mg/dL — ABNORMAL HIGH (ref <200)
Cholesterol, total: 238 mg/dL — ABNORMAL HIGH (ref <200)
Cholesterol/HDL ratio: 4.4 calc (ref <5.0)
HDL Cholesterol: 54 mg/dL (ref >50)
HDL: 54 mg/dL (ref >50)
LDL CHOL, CALCULATED: 168 mg/dL (calc) — ABNORMAL HIGH (ref <100)
LDL Calculated: 168 mg/dL (calc) — ABNORMAL HIGH (ref <100)
Non-HDL Cholesterol: 184 mg/dL (calc) — ABNORMAL HIGH (ref <130)
Non-HDL Cholesterol: 184 mg/dL (calc) — ABNORMAL HIGH (ref <130)
Triglyceride: 69 mg/dL (ref <150)
Triglycerides: 69 mg/dL (ref <150)

## 2018-01-20 LAB — FERRITIN
Ferritin: 67 ng/mL (ref 16–232)
Ferritin: 67 ng/mL (ref 16–232)

## 2018-01-20 LAB — VITAMIN D, 25 HYDROXY: VITAMIN D, 25-HYDROXY: 34 ng/mL (ref 30–100)

## 2018-01-20 LAB — VITAMIN B12
Vitamin B-12: 624 pg/mL (ref 200–1100)
Vitamin B12: 624 pg/mL (ref 200–1100)

## 2018-01-20 LAB — TSH REFLEX TO T4F
TSH: 0.82 mIU/L (ref 0.40–4.50)
TSH: 0.82 mIU/L (ref 0.40–4.50)

## 2018-01-20 LAB — COMPREHENSIVE METABOLIC PANEL
ALT: 15 U/L (ref 6–29)
AST: 15 U/L (ref 10–35)
Albumin/Globulin Ratio: 2 (calc) (ref 1.0–2.5)
Albumin: 4.4 g/dL (ref 3.6–5.1)
Alkaline Phosphatase: 66 U/L (ref 33–130)
BUN: 13 mg/dL (ref 7–25)
CO2: 27 mmol/L (ref 20–32)
Calcium: 9.4 mg/dL (ref 8.6–10.4)
Chloride: 107 mmol/L (ref 98–110)
Creatinine: 0.79 mg/dL (ref 0.50–1.05)
EGFR IF NonAfrican American: 85 mL/min/{1.73_m2} (ref >=60)
GFR African American: 99 mL/min/{1.73_m2} (ref >=60)
GLUCOSE, FASTING,GF: 90 mg/dL (ref 65–99)
Globulin: 2.2 g/dL (calc) (ref 1.9–3.7)
Potassium: 4.2 mmol/L (ref 3.5–5.3)
Sodium: 142 mmol/L (ref 135–146)
Total Bilirubin: 0.4 mg/dL (ref 0.2–1.2)
Total Protein: 6.6 g/dL (ref 6.1–8.1)

## 2018-01-20 LAB — CBC WITH AUTO DIFFERENTIAL
Basophils %: 0.5 % (ref 0–2)
Basophils Absolute: 29 cells/uL (ref 0–200)
Eosinophils %: 1.1 % (ref 0–8)
Eosinophils Absolute: 63 cells/uL (ref 15–500)
Hematocrit: 43.3 % (ref 35.0–45.0)
Hemoglobin: 14.5 g/dL (ref 11.7–15.5)
Lymphocytes %: 42.6 % (ref 15–49)
Lymphocytes Absolute: 2428 cells/uL (ref 850–3900)
MCH: 32.2 pg (ref 27.0–33.0)
MCHC: 33.5 g/dL (ref 32.0–36.0)
MCV: 96 fL (ref 80.0–100.0)
MPV: 11.5 fL (ref 7.5–12.5)
Monocytes %: 5.5 % (ref 0–13)
Monocytes Absolute: 314 cells/uL (ref 200–950)
Neutrophils %: 50.3 % (ref 38–80)
Neutrophils Absolute: 2867 cells/uL (ref 1500–7800)
Platelets: 209 10*3/uL (ref 140–400)
RBC: 4.51 10*6/uL (ref 3.80–5.10)
RDW: 12.4 % (ref 11.0–15.0)
WBC: 5.7 10*3/uL (ref 3.8–10.8)

## 2018-01-20 LAB — VITAMIN D 25 HYDROXY: Vit D, 25-Hydroxy: 34 ng/mL (ref 30–100)

## 2018-01-28 ENCOUNTER — Ambulatory Visit
Admit: 2018-01-28 | Discharge: 2018-01-28 | Payer: PRIVATE HEALTH INSURANCE | Attending: Internal Medicine | Primary: Internal Medicine

## 2018-01-28 ENCOUNTER — Ambulatory Visit: Attending: Internal Medicine | Primary: Internal Medicine

## 2018-01-28 DIAGNOSIS — Z0000 Encounter for general adult medical examination without abnormal findings: Secondary | ICD-10-CM

## 2018-01-28 NOTE — Progress Notes (Signed)
Chief Complaint:   Complete Physical      History of present illness:    Lindsay Liu is a 52 y.o. female with a past medical history as below who comes for annual complete physical exam.   Patient continues to smoke 5 to 7 cigarettes a day.  She is also gained weight since her last visit.  She states that since Labor Day she has been trying mindful of her eating and starting to exercise more.  She feels that she cannot maintain and improve diet and stop smoking at the same time.  She states that once she feels that her diet is under control she will attempt to stop smoking.  We discussed the use of Zyban and Wellbutrin and she will research this more and return when she is ready for smoking cessation                3 most recent PHQ Screens 01/28/2018   Little interest or pleasure in doing things Not at all   Feeling down, depressed, irritable, or hopeless Not at all   Total Score PHQ 2 0   Trouble falling or staying asleep, or sleeping too much Not at all   Feeling tired or having little energy Not at all   Poor appetite, weight loss, or overeating Not at all   Feeling bad about yourself - or that you are a failure or have let yourself or your family down Not at all   Trouble concentrating on things such as school, work, reading, or watching TV Not at all   Moving or speaking so slowly that other people could have noticed; or the opposite being so fidgety that others notice Not at all   Thoughts of being better off dead, or hurting yourself in some way Not at all   PHQ 9 Score 0   How difficult have these problems made it for you to do your work, take care of your home and get along with others Not difficult at all       [place] 01/28/2018   Have you ever felt the need to cut down on your drinking or drug use? No   Have people annoyed you by criticizing your drinking or drug use? 0   Have you ever felt guilty about drinking or drug use? 0   Have you ever had a drink or used drugs first thing in the morning to  steady your nerves or to get rid of a hangover (eye-opener)?  0   CAGE AID Score - >= 2 is considered clinically significant 0       CAGE-AID screening Negative, reviewed.    Review of Systems: Positive for-weight gain as above  Review of Systems is negative for chest pain, chest pressure, nausea, vomiting, diarrhea, headache, fever, chills, hemoptysis, epistaxis, hematemesis, bright red blood per rectum, hematuria, dysuria, dyspnea, orthopnea, melena, depression, anxiety, or any recent unexplained weight loss or weight gain except as mentioned in HPI.      Prior to Admission medications    Medication Sig Start Date End Date Taking? Authorizing Provider   omega-3s/dha/epa/fish oil (OMEGA 3 PO) Take  by mouth. Indications: 10,000 mg   Yes Provider, Historical   diclofenac EC (VOLTAREN) 50 mg EC tablet Take 1 Tab by mouth two (2) times a day. Indications: joint damage causing pain and loss of function 10/12/17  Yes Marlinda Mike, NP   Lactobacillus acidophilus (PROBIOTIC PO) Take  by mouth daily.   Yes  Provider, Historical   cetirizine HCl (ZYRTEC PO) Take  by mouth as needed.   Yes Provider, Historical   cholecalciferol, vitamin D3, (VITAMIN D3) 2,000 unit tab Take 2,000 Units by mouth daily.   Yes Provider, Historical   cyanocobalamin 1,000 mcg tablet Take 1,000 mcg by mouth daily.   Yes Provider, Historical            Allergies   Allergen Reactions   ??? Levaquin [Levofloxacin] Shortness of Breath     Cipro in the past without adverse effect   ??? Penicillin G Shortness of Breath           HISTORY:  Past Medical History:   Diagnosis Date   ??? Cervical spine pain     Oh   ??? Chronic urticaria    ??? Endometriosis    ??? Hypercholesterolemia    ??? Stye 10/2016    Dr Otho Perl -> [facility]   ??? Thyroid disease        Past Surgical History:   Procedure Laterality Date   ??? HX APPENDECTOMY     ??? HX CHOLECYSTECTOMY  01/1994   ??? HX LAPAROTOMY         Family History   Problem Relation Age of Onset   ??? Elevated Lipids Mother     ??? Heart Disease Mother    ??? Cancer Mother 10        multiple myeloma   ??? Heart Disease Father    ??? No Known Problems Daughter    ??? No Known Problems Son    ??? No Known Problems Sister    ??? Elevated Lipids Brother    ??? Elevated Lipids Brother        Social History     Tobacco Use   ??? Smoking status: Current Every Day Smoker     Packs/day: 0.25     Years: 30.00     Pack years: 7.50     Types: Cigarettes   ??? Smokeless tobacco: Never Used   ??? Tobacco comment: 5-7 cigarettes per day.   Substance Use Topics   ??? Alcohol use: Yes     Alcohol/week: 1.0 standard drinks     Types: 1 Standard drinks or equivalent per week     Frequency: Monthly or less     Drinks per session: 1 or 2     Binge frequency: Never     Comment: social   ??? Drug use: No       Immunization History   Administered Date(s) Administered   ??? Influenza Vaccine Javier Docker) 01/05/2015, 01/14/2017   ??? Influenza Vaccine (Quadrivalent) 01/28/2018   ??? Influenza Vaccine PF 02/24/2013   ??? Pneumococcal Polysaccharide (PPSV-23) 01/14/2017   ??? Tdap 01/05/2015       Patient Care Team:  Domingo Mend, MD as PCP - General (Internal Medicine)  Oh, Percival Spanish, MD (Orthopedic Surgery)  Everardo Pacific, NP  Lujean Amel, MD (Gastroenterology)  Comer Locket, MD (Ophthalmology)  Michele Mcalpine, MD (Obstetrics & Gynecology)      Objective:   Vitals:          Vitals:    01/28/18 1022   BP: 110/70   Pulse: 65   Resp: 16   Temp: 98.4 ??F (36.9 ??C)   TempSrc: Oral   SpO2: 97%   Weight: 150 lb (68 kg)   Height: _0  (1.6 m)       PHYSICAL EXAM:    General:    Alert, cooperative, no  distress, appears stated age.     HEENT: Atraumatic, anicteric sclerae, pink conjunctivae     No oral ulcers, mucosa moist, throat clear  Neck:  Supple, symmetrical,  thyroid: non tender  Back:    No CVA tenderness.  Lungs:   Clear to auscultation bilaterally.  No Wheezing or Rhonchi. No rales.  Chest wall:  No tenderness  No Accessory muscle use.  Heart:   Regular  rhythm,  No  murmur   No gallop.  No edema   Abdomen:   Soft, non-tender. Not distended.  Bowel sounds normal. No masses  Extremities: No cyanosis.  No clubbing No edema  Skin:     Not pale Not Jaundiced  No rashes   Lymph nodes: Cervical, supraclavicular normal.  Psych:  Good insight.  Not depressed.  Not anxious or agitated.  Neurologic: EOMs intact. No facial asymmetry. No aphasia or slurred speech. Normal   strength, Alert and oriented X 3.   Breasts: breasts appear normal, no suspicious masses, no skin or nipple changes or axillary nodes.    Lab Data Reviewed:   Orders Only on 01/18/2018   Component Date Value Ref Range Status   ??? VITAMIN D, 25-HYDROXY 01/18/2018 34  30 - 100 ng/mL Final    Comment:              Vitamin D Status         25-OH Vitamin D:                Deficiency:                    <20 ng/mL           Insufficiency:             20 - 29 ng/mL           Optimal:                 > or = 30 ng/mL               For 25-OH Vitamin D testing on patients on           D2-supplementation and patients for whom quantitation           of D2 and D3 fractions is required, the [facility](TM)           25-OH VIT D, (D2,D3), LC/MS/MS is recommended: order           code 69629B (patients >5yrs).               For more information on this test, go to:           http://education.[facility].com/faq/FAQ163     Orders Only on 01/18/2018   Component Date Value Ref Range Status   ??? Vitamin B12 01/18/2018 624  200 - 1,100 pg/mL Final   Orders Only on 01/18/2018   Component Date Value Ref Range Status   ??? Color 01/18/2018 Yellow  Yellow Final   ??? Appearance 01/18/2018 Clear  Clear Final   ??? Specific gravity 01/18/2018 1.018  1.001 - 1.035 N/A Final   ??? pH (UA) 01/18/2018 5.0  5.0 - 8.0 N/A Final   ??? Glucose 01/18/2018 Negative  Negative Final   ??? Bilirubin 01/18/2018 Negative  Negative Final   ??? Ketone 01/18/2018 Negative  Negative Final   ??? Blood 01/18/2018 Trace* Negative Final   ??? Protein 01/18/2018 Negative  Negative Final    ??? Nitrites  01/18/2018 Negative  Negative Final   ??? Leukocyte Esterase 01/18/2018 Negative  Negative Final   ??? WBC 01/18/2018 None Seen  <or=5  /hpf Final   ??? RBC 01/18/2018 None Seen  <or=2  /hpf Final   ??? SQUAMOUS EPITHELIAL CELLS 01/18/2018 None Seen  <or=5  /hpf Final   ??? Bacteria 01/18/2018 None Seen  None Seen  /hpf Final   ??? Hyaline cast 01/18/2018 None Seen  None Seen  /lpf Final   Orders Only on 01/18/2018   Component Date Value Ref Range Status   ??? WBC 01/18/2018 5.7  3.8 - 10.8 Thousand/uL Final   ??? RBC 01/18/2018 4.51  3.80 - 5.10 Million/uL Final   ??? HGB 01/18/2018 14.5  11.7 - 15.5 g/dL Final   ??? HCT 01/18/2018 43.3  35.0 - 45.0 % Final   ??? MCV 01/18/2018 96.0  80.0 - 100.0 fL Final   ??? MCH 01/18/2018 32.2  27.0 - 33.0 pg Final   ??? MCHC 01/18/2018 33.5  32.0 - 36.0 g/dL Final   ??? RDW 01/18/2018 12.4  11.0 - 15.0 % Final   ??? PLATELET 01/18/2018 209  140 - 400 Thousand/uL Final   ??? MEAN PLATELET VOLUME 01/18/2018 11.5  7.5 - 12.5 fL Final   ??? Neutrophils 01/18/2018 50.3  38 - 80 % Final   ??? LYMPHOCYTES 01/18/2018 42.6  15 - 49 % Final   ??? MONOCYTES 01/18/2018 5.5  0 - 13 % Final   ??? EOSINOPHILS 01/18/2018 1.1  0 - 8 % Final   ??? BASOPHILS 01/18/2018 0.5  0 - 2 % Final   ??? ABS. NEUTROPHILS 01/18/2018 2,867  1,500 - 7,800 cells/uL Final   ??? ABS. LYMPHOCYTES 01/18/2018 2,428  850 - 3,900 cells/uL Final   ??? ABS. MONOCYTES 01/18/2018 314  200 - 950 cells/uL Final   ??? ABS. EOSINOPHILS 01/18/2018 63  15 - 500 cells/uL Final   ??? ABS. BASOPHILS 01/18/2018 29  0 - 200 cells/uL Final   ??? Differential 01/18/2018    Final             An instrument differential was performed.   Orders Only on 01/18/2018   Component Date Value Ref Range Status   ??? Ferritin 01/18/2018 67  16 - 232 ng/mL Final   Orders Only on 01/18/2018   Component Date Value Ref Range Status   ??? TSH 01/18/2018 0.82  0.40 - 4.50 mIU/L Final   Orders Only on 01/18/2018   Component Date Value Ref Range Status    ??? Cholesterol, total 01/18/2018 238* <200 mg/dL Final   ??? HDL Cholesterol 01/18/2018 54  >50 mg/dL Final   ??? Cholesterol/HDL ratio 01/18/2018 4.4  <5.0 calc Final   ??? LDL CHOL, CALCULATED 01/18/2018 168* <100 mg/dL (calc) Final    Comment:              LDL-C is now calculated using the Martin-Hopkins calculation,           which is a validated novel method providing better accuracy           than the Friedewald equation in the estimation of LDL-C.           Cresenciano Genre et al. Annamaria Helling. 2013; 310(19): 2061-2068               For additional information, please refer to           http://education.[facility].com/faq/FAQ164           (  This link is being provided for informational/educational           purposes only.)               Desirable range < 100 mg/dL for primary prevention; <70 mg/dL           for patients with CHD or diabetic patients with > or = 2 CHD           risk factors.     ??? Triglyceride 01/18/2018 69  <150 mg/dL Final   ??? Non-HDL Cholesterol 01/18/2018 184* <130 mg/dL (calc) Final    Comment:              For patients with diabetes plus 1 major ASCVD risk           factor, treating to a non-HDL-C goal of <100 mg/dL           (LDL-C of <70 mg/dL) is considered a therapeutic           option.     Orders Only on 01/18/2018   Component Date Value Ref Range Status   ??? Glucose 01/18/2018 90  65 - 99 mg/dL Final   ??? Sodium 01/18/2018 142  135 - 146 mmol/L Final   ??? Potassium 01/18/2018 4.2  3.5 - 5.3 mmol/L Final   ??? Chloride 01/18/2018 107  98 - 110 mmol/L Final   ??? CO2 01/18/2018 27  20 - 32 mmol/L Final   ??? BUN 01/18/2018 13  7 - 25 mg/dL Final   ??? Creatinine 01/18/2018 0.79  0.50 - 1.05 mg/dL Final    Comment:          The upper reference limit for Creatinine is approximately           13% higher for people identified as African-American.     ??? BUN/Creatinine ratio 01/18/2018 NOTE  6 - 22 (calc) Final    Comment:          Bun/Creatinine ratio is not reported when the Bun and            Creatinine values are within normal limits.     ??? Calcium 01/18/2018 9.4  8.6 - 10.4 mg/dL Final   ??? Protein, total 01/18/2018 6.6  6.1 - 8.1 g/dL Final   ??? Albumin 01/18/2018 4.4  3.6 - 5.1 g/dL Final   ??? Globulin 01/18/2018 2.2  1.9 - 3.7 g/dL (calc) Final   ??? ALB/GLOBRATIO 01/18/2018 2.0  1.0 - 2.5 (calc) Final   ??? Bilirubin, total 01/18/2018 0.4  0.2 - 1.2 mg/dL Final   ??? Alk. phosphatase 01/18/2018 66  33 - 130 U/L Final   ??? AST (SGOT) 01/18/2018 15  10 - 35 U/L Final   ??? ALT (SGPT) 01/18/2018 15  6 - 29 U/L Final   ??? EGFR NON AFR AMERICAN 01/18/2018 85  >=60 mL/min/1.11m Final   ??? GFR est AA 01/18/2018 99  >=60 mL/min/1.744mFinal          EKG No results found for any visits on 01/28/18.      Health Maintenance   Topic Date Due   ??? Shingrix Vaccine Age 733>1 of 2) 02/22/2014   ??? BREAST CANCER SCRN MAMMOGRAM  02/12/2019   ??? PAP AKA CERVICAL CYTOLOGY  03/19/2020   ??? COLONOSCOPY  03/20/2020   ??? DTaP/Tdap/Td series (2 - Td) 01/04/2025   ??? Bone Densitometry (Dexa) Screening  02/22/2029   ??? Hepatitis C Screening  Completed   ???  Influenza Age 42 to Adult  Completed   ??? Pneumococcal 0-64 years  Completed         Assessment/Plan:   1. Well woman exam (no gynecological exam)  Smoking cessation advised.  Patient to go for mammography and also to follow-up with GYN.  Up-to-date anoscopy.  Flu vaccination given today.  Discussed Shingrix    2. Screening mammogram, encounter for    - MAM 3D TOMO W MAMMO BI SCREENING INCL CAD; Future    3. Encounter for immunization    - INFLUENZA VIRUS VACCINE, QUADRIVALENT (RIV4), DERIVED FROM RECOMBINANT DNA,HEMAGGLUTININ(HA) PROTEIN ONLY, PF ABX FREE    4. Current every day smoker  .She feels that she cannot maintain and improve diet and stop smoking at the same time.  She states that once she feels that her diet is under control she will attempt to stop smoking.  We discussed the use of Zyban and Wellbutrin and she will research this more and return when she is  ready for smoking cessation    - PR SMOKE/TOBACCO COUNSELNG 3-10    5. Hyperlipidemia, unspecified hyperlipidemia type  Patient reluctant to start medications.  Will improve dietary and exercise measures and recheck in 1 year        The patient is advised to begin progressive daily aerobic exercise program, follow a low fat, low cholesterol diet, attempt to lose weight, reduce exposure to stress, improve dietary compliance, continue current healthy lifestyle patterns and return for routine annual checkups.  Discussed the patient's I have reviewed/discussed the above normal BMI with the patient.  I have recommended the following interventions: dietary management education, guidance, and counseling and encourage exercise .       Care Plan discussed with:   _0 Patient   _1 Family    _2 Care Manager    _3 Nursing   _4 Consultant/Specialist :   _5   >50% of visit spent in counseling and coordination of care       Discussed     _6 Advanced Directives,  _7  smoking cessation   _8  diet   _9 excercise   _10 weight loss recommended  _11 seat belts  _12 sun screen usage  _13 safe sex practices  [  ] texting while driving  [  ]cell phone use while driving[ x] medication usage, compliance, and side effects          Follow up in   Follow-up and Dispositions    ?? Return in about 1 year (around 01/29/2019).               Note: This report was transcribed using voice recognition software and is thus   prone to syntax and contextual spelling errors. Please do not hesitate to call   me if anything needs clarification or a dictated addendum         ___________________________________________________     Domingo Mend, MD     ___________________________________________________

## 2018-01-28 NOTE — Patient Instructions (Signed)
Vaccine Information Statement    Influenza (Flu) Vaccine (Inactivated or Recombinant): What You Need to Know    Many Vaccine Information Statements are available in Spanish and other languages. See www.immunize.org/vis  Hojas de informaci??n sobre vacunas est??n disponibles en espa??ol y en muchos otros idiomas. Visite www.immunize.org/vis    1. Why get vaccinated?    Influenza vaccine can prevent influenza (flu).    Flu is a contagious disease that spreads around the [place] every year, usually between October and May. Anyone can get the flu, but it is more dangerous for some people. Infants and young children, people 65 years of age and older, pregnant women, and people with certain health conditions or a weakened immune system are at greatest risk of flu complications.    Pneumonia, bronchitis, sinus infections and ear infections are examples of flu-related complications. If you have a medical condition, such as heart disease, cancer or diabetes, flu can make it worse.    Flu can cause fever and chills, sore throat, muscle aches, fatigue, cough, headache, and runny or stuffy nose. Some people may have vomiting and diarrhea, though this is more common in children than adults.     Each year thousands of people in the [place] die from flu, and many more are hospitalized. Flu vaccine prevents millions of illnesses and flu-related visits to the doctor each year.    2. Influenza vaccines     CDC recommends everyone 6 months of age and older get vaccinated every flu season. Children 6 months through 8 years of age may need 2 doses during a single flu season.  Everyone else needs only 1 dose each flu season.    It takes about 2 weeks for protection to develop after vaccination.    There are many flu viruses, and they are always changing. Each year a new flu vaccine is made to protect against three or four viruses that are likely to cause disease in the upcoming flu season. Even when the vaccine  doesn???t exactly match these viruses, it may still provide some protection.     Influenza vaccine does not cause flu.    Influenza vaccine may be given at the same time as other vaccines.    3. Talk with your health care provider    Tell your vaccine provider if the person getting the vaccine:  ??? Has had an allergic reaction after a previous dose of influenza vaccine, or has any severe, life-threatening allergies.   ??? Has ever had Guillain-Barr?? Syndrome (also called GBS).    In some cases, your health care provider may decide to postpone influenza vaccination to a future visit.    People with minor illnesses, such as a cold, may be vaccinated. People who are moderately or severely ill should usually wait until they recover before getting influenza vaccine.    Your health care provider can give you more information.    4. Risks of a reaction    ??? Soreness, redness, and swelling where shot is given, fever, muscle aches, and headache can happen after influenza vaccine.  ??? There may be a very small increased risk of Guillain-Barr?? Syndrome (GBS) after inactivated influenza vaccine (the flu shot).    Young children who get the flu shot along with pneumococcal vaccine (PCV13), and/or DTaP vaccine at the same time might be slightly more likely to have a seizure caused by fever. Tell your health care provider if a child who is getting flu vaccine has ever had a   seizure.    People sometimes faint after medical procedures, including vaccination. Tell your provider if you feel dizzy or have vision changes or ringing in the ears.    As with any medicine, there is a very remote chance of a vaccine causing a severe allergic reaction, other serious injury, or death.    5. What if there is a serious problem?    An allergic reaction could occur after the vaccinated person leaves the clinic. If you see signs of a severe allergic reaction (hives, swelling of the face and throat, difficulty breathing, a fast heartbeat, dizziness, or  weakness), call 9-1-1 and get the person to the nearest hospital.    For other signs that concern you, call your health care provider.    Adverse reactions should be reported to the Vaccine Adverse Event Reporting System (VAERS). Your health care provider will usually file this report, or you can do it yourself. Visit the VAERS website at www.vaers.hhs.gov or call 1-800-822-7967.  VAERS is only for reporting reactions, and VAERS staff do not give medical advice.    6. The National Vaccine Injury Compensation Program    The National Vaccine Injury Compensation Program (VICP) is a federal program that was created to compensate people who may have been injured by certain vaccines. Visit the VICP website at www.hrsa.gov/vaccinecompensation or call 1-800-338-2382 to learn about the program and about filing a claim. There is a time limit to file a claim for compensation.    7. How can I learn more?    ??? Ask your health care provider.   ??? Call your local or state health department.  ??? Contact the Centers for Disease Control and Prevention (CDC):  - Call 1-800-232-4636 (1-800-CDC-INFO) or  - Visit CDC???s influenza website at www.cdc.gov/flu    Vaccine Information Statement (Interim)  Inactivated Influenza Vaccine   11/26/2017  42 U.S[place] ?? 300aa-26   Department of Health and Human Services  Centers for Disease Control and Prevention    Office Use Only

## 2018-01-28 NOTE — Progress Notes (Signed)
Chief Complaint:   Complete Physical      History of present illness:    Lindsay Liu is a 52 y.o. female with a past medical history as below who comes for annual complete physical exam.   Patient continues to smoke 5 to 7 cigarettes a day.  She is also gained weight since her last visit.  She states that since Labor Day she has been trying mindful of her eating and starting to exercise more.  She feels that she cannot maintain and improve diet and stop smoking at the same time.  She states that once she feels that her diet is under control she will attempt to stop smoking.  We discussed the use of Zyban and Wellbutrin and she will research this more and return when she is ready for smoking cessation                3 most recent PHQ Screens 01/28/2018   Little interest or pleasure in doing things Not at all   Feeling down, depressed, irritable, or hopeless Not at all   Total Score PHQ 2 0   Trouble falling or staying asleep, or sleeping too much Not at all   Feeling tired or having little energy Not at all   Poor appetite, weight loss, or overeating Not at all   Feeling bad about yourself - or that you are a failure or have let yourself or your family down Not at all   Trouble concentrating on things such as school, work, reading, or watching TV Not at all   Moving or speaking so slowly that other people could have noticed; or the opposite being so fidgety that others notice Not at all   Thoughts of being better off dead, or hurting yourself in some way Not at all   PHQ 9 Score 0   How difficult have these problems made it for you to do your work, take care of your home and get along with others Not difficult at all       [place] 01/28/2018   Have you ever felt the need to cut down on your drinking or drug use? No   Have people annoyed you by criticizing your drinking or drug use? 0   Have you ever felt guilty about drinking or drug use? 0   Have you ever had a drink or used drugs first thing in the morning to  steady your nerves or to get rid of a hangover (eye-opener)?  0   CAGE AID Score - >= 2 is considered clinically significant 0       CAGE-AID screening Negative, reviewed.    Review of Systems: Positive for-weight gain as above  Review of Systems is negative for chest pain, chest pressure, nausea, vomiting, diarrhea, headache, fever, chills, hemoptysis, epistaxis, hematemesis, bright red blood per rectum, hematuria, dysuria, dyspnea, orthopnea, melena, depression, anxiety, or any recent unexplained weight loss or weight gain except as mentioned in HPI.      Prior to Admission medications    Medication Sig Start Date End Date Taking? Authorizing Provider   omega-3s/dha/epa/fish oil (OMEGA 3 PO) Take  by mouth. Indications: 10,000 mg   Yes Provider, Historical   diclofenac EC (VOLTAREN) 50 mg EC tablet Take 1 Tab by mouth two (2) times a day. Indications: joint damage causing pain and loss of function 10/12/17  Yes Marlinda Mike, NP   Lactobacillus acidophilus (PROBIOTIC PO) Take  by mouth daily.   Yes  Provider, Historical   cetirizine HCl (ZYRTEC PO) Take  by mouth as needed.   Yes Provider, Historical   cholecalciferol, vitamin D3, (VITAMIN D3) 2,000 unit tab Take 2,000 Units by mouth daily.   Yes Provider, Historical   cyanocobalamin 1,000 mcg tablet Take 1,000 mcg by mouth daily.   Yes Provider, Historical            Allergies   Allergen Reactions   ??? Levaquin [Levofloxacin] Shortness of Breath     Cipro in the past without adverse effect   ??? Penicillin G Shortness of Breath           HISTORY:  Past Medical History:   Diagnosis Date   ??? Cervical spine pain     Oh   ??? Chronic urticaria    ??? Endometriosis    ??? Hypercholesterolemia    ??? Stye 10/2016    Dr Otho Perl -> [facility]   ??? Thyroid disease        Past Surgical History:   Procedure Laterality Date   ??? HX APPENDECTOMY     ??? HX CHOLECYSTECTOMY  01/1994   ??? HX LAPAROTOMY         Family History   Problem Relation Age of Onset   ??? Elevated Lipids Mother    ??? Heart  Disease Mother    ??? Cancer Mother 58        multiple myeloma   ??? Heart Disease Father    ??? No Known Problems Daughter    ??? No Known Problems Son    ??? No Known Problems Sister    ??? Elevated Lipids Brother    ??? Elevated Lipids Brother        Social History     Tobacco Use   ??? Smoking status: Current Every Day Smoker     Packs/day: 0.25     Years: 30.00     Pack years: 7.50     Types: Cigarettes   ??? Smokeless tobacco: Never Used   ??? Tobacco comment: 5-7 cigarettes per day.   Substance Use Topics   ??? Alcohol use: Yes     Alcohol/week: 1.0 standard drinks     Types: 1 Standard drinks or equivalent per week     Frequency: Monthly or less     Drinks per session: 1 or 2     Binge frequency: Never     Comment: social   ??? Drug use: No       Immunization History   Administered Date(s) Administered   ??? Influenza Vaccine Javier Docker) 01/05/2015, 01/14/2017   ??? Influenza Vaccine (Quadrivalent) 01/28/2018   ??? Influenza Vaccine PF 02/24/2013   ??? Pneumococcal Polysaccharide (PPSV-23) 01/14/2017   ??? Tdap 01/05/2015       Patient Care Team:  Domingo Mend, MD as PCP - General (Internal Medicine)  Oh, Percival Spanish, MD (Orthopedic Surgery)  Everardo Pacific, NP  Lujean Amel, MD (Gastroenterology)  Comer Locket, MD (Ophthalmology)  Michele Mcalpine, MD (Obstetrics & Gynecology)      Objective:   Vitals:          Vitals:    01/28/18 1022   BP: 110/70   Pulse: 65   Resp: 16   Temp: 98.4 ??F (36.9 ??C)   TempSrc: Oral   SpO2: 97%   Weight: 150 lb (68 kg)   Height: 5' 3"  (1.6 m)       PHYSICAL EXAM:    General:    Alert, cooperative, no  distress, appears stated age.     HEENT: Atraumatic, anicteric sclerae, pink conjunctivae     No oral ulcers, mucosa moist, throat clear  Neck:  Supple, symmetrical,  thyroid: non tender  Back:    No CVA tenderness.  Lungs:   Clear to auscultation bilaterally.  No Wheezing or Rhonchi. No rales.  Chest wall:  No tenderness  No Accessory muscle use.  Heart:   Regular  rhythm,  No  murmur   No gallop.  No edema  Abdomen:    Soft, non-tender. Not distended.  Bowel sounds normal. No masses  Extremities: No cyanosis.  No clubbing No edema  Skin:     Not pale Not Jaundiced  No rashes   Lymph nodes: Cervical, supraclavicular normal.  Psych:  Good insight.  Not depressed.  Not anxious or agitated.  Neurologic: EOMs intact. No facial asymmetry. No aphasia or slurred speech. Normal   strength, Alert and oriented X 3.   Breasts: breasts appear normal, no suspicious masses, no skin or nipple changes or axillary nodes.    Lab Data Reviewed:   Orders Only on 01/18/2018   Component Date Value Ref Range Status   ??? VITAMIN D, 25-HYDROXY 01/18/2018 34  30 - 100 ng/mL Final    Comment:              Vitamin D Status         25-OH Vitamin D:                Deficiency:                    <20 ng/mL           Insufficiency:             20 - 29 ng/mL           Optimal:                 > or = 30 ng/mL               For 25-OH Vitamin D testing on patients on           D2-supplementation and patients for whom quantitation           of D2 and D3 fractions is required, the [facility](TM)           25-OH VIT D, (D2,D3), LC/MS/MS is recommended: order           code 10932T (patients >25yr).               For more information on this test, go to:           http://education.[facility].com/faq/FAQ163     Orders Only on 01/18/2018   Component Date Value Ref Range Status   ??? Vitamin B12 01/18/2018 624  200 - 1,100 pg/mL Final   Orders Only on 01/18/2018   Component Date Value Ref Range Status   ??? Color 01/18/2018 Yellow  Yellow Final   ??? Appearance 01/18/2018 Clear  Clear Final   ??? Specific gravity 01/18/2018 1.018  1.001 - 1.035 N/A Final   ??? pH (UA) 01/18/2018 5.0  5.0 - 8.0 N/A Final   ??? Glucose 01/18/2018 Negative  Negative Final   ??? Bilirubin 01/18/2018 Negative  Negative Final   ??? Ketone 01/18/2018 Negative  Negative Final   ??? Blood 01/18/2018 Trace* Negative Final   ??? Protein 01/18/2018 Negative  Negative Final   ??? Nitrites 01/18/2018  Negative  Negative  Final   ??? Leukocyte Esterase 01/18/2018 Negative  Negative Final   ??? WBC 01/18/2018 None Seen  <or=5  /hpf Final   ??? RBC 01/18/2018 None Seen  <or=2  /hpf Final   ??? SQUAMOUS EPITHELIAL CELLS 01/18/2018 None Seen  <or=5  /hpf Final   ??? Bacteria 01/18/2018 None Seen  None Seen  /hpf Final   ??? Hyaline cast 01/18/2018 None Seen  None Seen  /lpf Final   Orders Only on 01/18/2018   Component Date Value Ref Range Status   ??? WBC 01/18/2018 5.7  3.8 - 10.8 Thousand/uL Final   ??? RBC 01/18/2018 4.51  3.80 - 5.10 Million/uL Final   ??? HGB 01/18/2018 14.5  11.7 - 15.5 g/dL Final   ??? HCT 01/18/2018 43.3  35.0 - 45.0 % Final   ??? MCV 01/18/2018 96.0  80.0 - 100.0 fL Final   ??? MCH 01/18/2018 32.2  27.0 - 33.0 pg Final   ??? MCHC 01/18/2018 33.5  32.0 - 36.0 g/dL Final   ??? RDW 01/18/2018 12.4  11.0 - 15.0 % Final   ??? PLATELET 01/18/2018 209  140 - 400 Thousand/uL Final   ??? MEAN PLATELET VOLUME 01/18/2018 11.5  7.5 - 12.5 fL Final   ??? Neutrophils 01/18/2018 50.3  38 - 80 % Final   ??? LYMPHOCYTES 01/18/2018 42.6  15 - 49 % Final   ??? MONOCYTES 01/18/2018 5.5  0 - 13 % Final   ??? EOSINOPHILS 01/18/2018 1.1  0 - 8 % Final   ??? BASOPHILS 01/18/2018 0.5  0 - 2 % Final   ??? ABS. NEUTROPHILS 01/18/2018 2,867  1,500 - 7,800 cells/uL Final   ??? ABS. LYMPHOCYTES 01/18/2018 2,428  850 - 3,900 cells/uL Final   ??? ABS. MONOCYTES 01/18/2018 314  200 - 950 cells/uL Final   ??? ABS. EOSINOPHILS 01/18/2018 63  15 - 500 cells/uL Final   ??? ABS. BASOPHILS 01/18/2018 29  0 - 200 cells/uL Final   ??? Differential 01/18/2018    Final             An instrument differential was performed.   Orders Only on 01/18/2018   Component Date Value Ref Range Status   ??? Ferritin 01/18/2018 67  16 - 232 ng/mL Final   Orders Only on 01/18/2018   Component Date Value Ref Range Status   ??? TSH 01/18/2018 0.82  0.40 - 4.50 mIU/L Final   Orders Only on 01/18/2018   Component Date Value Ref Range Status   ??? Cholesterol, total 01/18/2018 238* <200 mg/dL Final   ??? HDL Cholesterol 01/18/2018 54   >50 mg/dL Final   ??? Cholesterol/HDL ratio 01/18/2018 4.4  <5.0 calc Final   ??? LDL CHOL, CALCULATED 01/18/2018 168* <100 mg/dL (calc) Final    Comment:              LDL-C is now calculated using the Martin-Hopkins calculation,           which is a validated novel method providing better accuracy           than the Friedewald equation in the estimation of LDL-C.           Cresenciano Genre et al. Annamaria Helling. 2013; 310(19): 2061-2068               For additional information, please refer to           http://education.[facility].com/faq/FAQ164           (  This link is being provided for informational/educational           purposes only.)               Desirable range < 100 mg/dL for primary prevention; <70 mg/dL           for patients with CHD or diabetic patients with > or = 2 CHD           risk factors.     ??? Triglyceride 01/18/2018 69  <150 mg/dL Final   ??? Non-HDL Cholesterol 01/18/2018 184* <130 mg/dL (calc) Final    Comment:              For patients with diabetes plus 1 major ASCVD risk           factor, treating to a non-HDL-C goal of <100 mg/dL           (LDL-C of <70 mg/dL) is considered a therapeutic           option.     Orders Only on 01/18/2018   Component Date Value Ref Range Status   ??? Glucose 01/18/2018 90  65 - 99 mg/dL Final   ??? Sodium 01/18/2018 142  135 - 146 mmol/L Final   ??? Potassium 01/18/2018 4.2  3.5 - 5.3 mmol/L Final   ??? Chloride 01/18/2018 107  98 - 110 mmol/L Final   ??? CO2 01/18/2018 27  20 - 32 mmol/L Final   ??? BUN 01/18/2018 13  7 - 25 mg/dL Final   ??? Creatinine 01/18/2018 0.79  0.50 - 1.05 mg/dL Final    Comment:          The upper reference limit for Creatinine is approximately           13% higher for people identified as African-American.     ??? BUN/Creatinine ratio 01/18/2018 NOTE  6 - 22 (calc) Final    Comment:          Bun/Creatinine ratio is not reported when the Bun and           Creatinine values are within normal limits.     ??? Calcium 01/18/2018 9.4  8.6 - 10.4 mg/dL Final   ??? Protein,  total 01/18/2018 6.6  6.1 - 8.1 g/dL Final   ??? Albumin 01/18/2018 4.4  3.6 - 5.1 g/dL Final   ??? Globulin 01/18/2018 2.2  1.9 - 3.7 g/dL (calc) Final   ??? ALB/GLOBRATIO 01/18/2018 2.0  1.0 - 2.5 (calc) Final   ??? Bilirubin, total 01/18/2018 0.4  0.2 - 1.2 mg/dL Final   ??? Alk. phosphatase 01/18/2018 66  33 - 130 U/L Final   ??? AST (SGOT) 01/18/2018 15  10 - 35 U/L Final   ??? ALT (SGPT) 01/18/2018 15  6 - 29 U/L Final   ??? EGFR NON AFR AMERICAN 01/18/2018 85  >=60 mL/min/1.52m Final   ??? GFR est AA 01/18/2018 99  >=60 mL/min/1.74mFinal          EKG No results found for any visits on 01/28/18.      Health Maintenance   Topic Date Due   ??? Shingrix Vaccine Age 54>1 of 2) 02/22/2014   ??? BREAST CANCER SCRN MAMMOGRAM  02/12/2019   ??? PAP AKA CERVICAL CYTOLOGY  03/19/2020   ??? COLONOSCOPY  03/20/2020   ??? DTaP/Tdap/Td series (2 - Td) 01/04/2025   ??? Bone Densitometry (Dexa) Screening  02/22/2029   ??? Hepatitis C Screening  Completed   ???  Influenza Age 60 to Adult  Completed   ??? Pneumococcal 0-64 years  Completed         Assessment/Plan:   1. Well woman exam (no gynecological exam)  Smoking cessation advised.  Patient to go for mammography and also to follow-up with GYN.  Up-to-date anoscopy.  Flu vaccination given today.  Discussed Shingrix    2. Screening mammogram, encounter for    - MAM 3D TOMO W MAMMO BI SCREENING INCL CAD; Future    3. Encounter for immunization    - INFLUENZA VIRUS VACCINE, QUADRIVALENT (RIV4), DERIVED FROM RECOMBINANT DNA,HEMAGGLUTININ(HA) PROTEIN ONLY, PF ABX FREE    4. Current every day smoker  .She feels that she cannot maintain and improve diet and stop smoking at the same time.  She states that once she feels that her diet is under control she will attempt to stop smoking.  We discussed the use of Zyban and Wellbutrin and she will research this more and return when she is ready for smoking cessation    - PR SMOKE/TOBACCO COUNSELNG 3-10    5. Hyperlipidemia, unspecified hyperlipidemia type  Patient  reluctant to start medications.  Will improve dietary and exercise measures and recheck in 1 year        The patient is advised to begin progressive daily aerobic exercise program, follow a low fat, low cholesterol diet, attempt to lose weight, reduce exposure to stress, improve dietary compliance, continue current healthy lifestyle patterns and return for routine annual checkups.  Discussed the patient's I have reviewed/discussed the above normal BMI with the patient.  I have recommended the following interventions: dietary management education, guidance, and counseling and encourage exercise .       Care Plan discussed with:   [x] Patient   [] Family    [] Care Manager    [] Nursing   [] Consultant/Specialist :   [x]   >50% of visit spent in counseling and coordination of care       Discussed     [] Advanced Directives,  [x]  smoking cessation   [x]  diet   [x] excercise   [x] weight loss recommended  [] seat belts  [] sun screen usage  [] safe sex practices  [  ] texting while driving  [  ]cell phone use while driving[ x] medication usage, compliance, and side effects          Follow up in   Follow-up and Dispositions    ?? Return in about 1 year (around 01/29/2019).               Note: This report was transcribed using voice recognition software and is thus   prone to syntax and contextual spelling errors. Please do not hesitate to call   me if anything needs clarification or a dictated addendum         ___________________________________________________     Domingo Mend, MD     ___________________________________________________

## 2018-02-15 ENCOUNTER — Encounter

## 2018-02-15 NOTE — Progress Notes (Signed)
mammography is normal.  Repeat mammography in one year

## 2018-06-28 ENCOUNTER — Encounter

## 2018-06-28 MED ORDER — DICLOFENAC 50 MG TAB, DELAYED RELEASE
50 mg | ORAL_TABLET | ORAL | 1 refills | Status: DC
Start: 2018-06-28 — End: 2018-08-30

## 2018-08-02 ENCOUNTER — Telehealth
Admit: 2018-08-02 | Discharge: 2018-08-02 | Payer: PRIVATE HEALTH INSURANCE | Attending: Family | Primary: Internal Medicine

## 2018-08-02 ENCOUNTER — Telehealth: Attending: Family | Primary: Internal Medicine

## 2018-08-02 DIAGNOSIS — N3000 Acute cystitis without hematuria: Secondary | ICD-10-CM

## 2018-08-02 MED ORDER — TRIMETHOPRIM-SULFAMETHOXAZOLE 160 MG-800 MG TAB
160-800 mg | ORAL_TABLET | Freq: Two times a day (BID) | ORAL | 0 refills | Status: AC
Start: 2018-08-02 — End: 2018-08-09

## 2018-08-02 NOTE — Progress Notes (Signed)
In light of the state of emergency declared in [place], [place], and the [place], with regards to the global pandemic known as COVID-19, I personally made this call in response to a request from an established patient of our practice to determine if an office visit is needed. I understand the call won't be paid if it is regard to any problems for which the patient has been seen in the office in the past 7 days, and the call won't be paid if the patient comes to the office for the same problem in the next 24 hours. The patient has given verbal consent for [place] to bill their insurance and they know they will be subject to deductibles and coinsurance.    The patient has given consent to a virtual visit and understands:    ??? The patient has the right to refuse a virtual visit and have a face-to-face visit.  ??? The patient has the right to know the professional credentials of the person who will be speaking to them.  ??? The patient has the right to know the location of the provider and what technology they will be using.  ??? The patient has the right to have appropriate staff available to them immediately while receiving telehealth services for emergencies or immediate needs.  ??? The patient has the right to know the identities of all persons on the call.  ??? The patient has the right to ask for a different provider and that, if they request a different provider, there may be a delay or a face-to-face visit may be needed.  ??? Telehealth calls cannot be recorded without the patient???s consent.  ??? Translation services must be provided if needed.  ??? The patient must be informed that coinsurances and deductibles will be applied by Medicare and they are responsible for these balances.      Lindsay Liu was seen as a virtual video visit and consent obtained.    Lindsay Liu was evaluated while she was at home, while the provider was at home, utilizing a telephone    Others participating in the visit  were none.        Subjective:     Lindsay Liu is a 53 y.o. female who complains of dysuria, frequency, urgency, suprapubic pressure for 3 days. Patient denies flank pain, nausea, vomiting, fever, unusual vaginal discharge. Patient does have a history of recurrent UTI.  Patient does not have a history of pyelonephritis.    Patient Active Problem List    Diagnosis Date Noted   ??? Current every day smoker 12/26/2015     Current Outpatient Medications   Medication Sig Dispense Refill   ??? trimethoprim-sulfamethoxazole (Bactrim DS) 160-800 mg per tablet Take 1 Tab by mouth two (2) times a day for 7 days. 14 Tab 0   ??? diclofenac EC (VOLTAREN) 50 mg EC tablet TAKE 1 TABLET BY MOUTH TWO (2) TIMES A DAY FOR JOINT DAMAGE CAUSING PAIN AND LOSS OF FUNCTION 60 Tab 1   ??? omega-3s/dha/epa/fish oil (OMEGA 3 PO) Take  by mouth. Indications: 10,000 mg     ??? Lactobacillus acidophilus (PROBIOTIC PO) Take  by mouth daily.     ??? cetirizine HCl (ZYRTEC PO) Take  by mouth as needed.     ??? cholecalciferol, vitamin D3, (VITAMIN D3) 2,000 unit tab Take 2,000 Units by mouth daily.     ??? cyanocobalamin 1,000 mcg tablet Take 1,000 mcg by mouth daily.  Allergies   Allergen Reactions   ??? Levaquin [Levofloxacin] Shortness of Breath     Cipro in the past without adverse effect   ??? Penicillin G Shortness of Breath     Social History     Tobacco Use   ??? Smoking status: Current Every Day Smoker     Packs/day: 0.25     Years: 30.00     Pack years: 7.50     Types: Cigarettes   ??? Smokeless tobacco: Never Used   ??? Tobacco comment: 5-7 cigarettes per day.   Substance Use Topics   ??? Alcohol use: Yes     Alcohol/week: 1.0 standard drinks     Types: 1 Standard drinks or equivalent per week     Frequency: Monthly or less     Drinks per session: 1 or 2     Binge frequency: Never     Comment: social        Review of Systems  Pertinent items are noted in HPI.    Objective:     There were no vitals taken for this visit.  There were no vitals filed for this  visit.  As this is a telephone/telehealth visit  No physical obtained. As this is a telephone/telehealth visit    Physical Exam  Constitutional:       General: She is not in acute distress.     Appearance: She is not toxic-appearing.   Pulmonary:      Effort: Pulmonary effort is normal. No respiratory distress.   Neurological:      Mental Status: She is alert.   Psychiatric:         Mood and Affect: Mood normal.         Behavior: Behavior normal.         Thought Content: Thought content normal.         Judgment: Judgment normal.            Assessment/Plan:     Acute cystitis     1. TMP/SMX  2. Maintain adequate hydration  3. May use OTC pyridium as desired, which will turn urine orange/red color  4. Follow up if symptoms not improving, and prn.  5. Probiotic      ICD-10-CM ICD-9-CM    1. Acute cystitis without hematuria N30.00 595.0 trimethoprim-sulfamethoxazole (Bactrim DS) 160-800 mg per tablet     Follow-up and Dispositions    ?? Return if symptoms worsen or fail to improve.     .Marland Kitchen

## 2018-08-02 NOTE — Progress Notes (Signed)
I reviewed the patient's medical history, the nurse practitioner's findings on physical examination, the patient's diagnoses, and treatment plan as documented in the progress note. I concur with the treatment plan as documented. There are no additional recommendations at this time.    Tekela Garguilo, MD

## 2018-08-02 NOTE — Progress Notes (Signed)
I reviewed the patient's medical history, the nurse practitioner's findings on physical examination, the patient's diagnoses, and treatment plan as documented in the progress note. I concur with the treatment plan as documented. There are no additional recommendations at this time.    Ersel Wadleigh, MD

## 2018-08-02 NOTE — Progress Notes (Signed)
In light of the state of emergency declared in [place], [place], and [place], with regards to the global pandemic known as COVID-19, I personally made this call in response to a request from an established patient of our practice to determine if an office visit is needed. I understand the call won't be paid if it is regard to any problems for which the patient has been seen in the office in the past 7 days, and the call won't be paid if the patient comes to the office for the same problem in the next 24 hours. The patient has given verbal consent for us to bill their insurance and they know they will be subject to deductibles and coinsurance.    The patient has given consent to a virtual visit and understands:    ? The patient has the right to refuse a virtual visit and have a face-to-face visit.  ? The patient has the right to know the professional credentials of the person who will be speaking to them.  ? The patient has the right to know the location of the provider and what technology they will be using.  ? The patient has the right to have appropriate staff available to them immediately while receiving telehealth services for emergencies or immediate needs.  ? The patient has the right to know the identities of all persons on the call.  ? The patient has the right to ask for a different provider and that, if they request a different provider, there may be a delay or a face-to-face visit may be needed.  ? Telehealth calls cannot be recorded without the patient???s consent.  ? Translation services must be provided if needed.  ? The patient must be informed that coinsurances and deductibles will be applied by Medicare and they are responsible for these balances.      Lindsay Liu was seen as a virtual video visit and consent obtained.    Lindsay Liu was evaluated while she was at home, while the provider was at home, utilizing a telephone     Others participating in the visit were none.        Subjective:     Lindsay HarrisColette Liu is a 53 y.o. female who complains of dysuria, frequency, urgency, suprapubic pressure for 3 days. Patient denies flank pain, nausea, vomiting, fever, unusual vaginal discharge. Patient does have a history of recurrent UTI.  Patient does not have a history of pyelonephritis.    Patient Active Problem List    Diagnosis Date Noted   ??? Current every day smoker 12/26/2015     Current Outpatient Medications   Medication Sig Dispense Refill   ??? trimethoprim-sulfamethoxazole (Bactrim DS) 160-800 mg per tablet Take 1 Tab by mouth two (2) times a day for 7 days. 14 Tab 0   ??? diclofenac EC (VOLTAREN) 50 mg EC tablet TAKE 1 TABLET BY MOUTH TWO (2) TIMES A DAY FOR JOINT DAMAGE CAUSING PAIN AND LOSS OF FUNCTION 60 Tab 1   ??? omega-3s/dha/epa/fish oil (OMEGA 3 PO) Take  by mouth. Indications: 10,000 mg     ??? Lactobacillus acidophilus (PROBIOTIC PO) Take  by mouth daily.     ??? cetirizine HCl (ZYRTEC PO) Take  by mouth as needed.     ??? cholecalciferol, vitamin D3, (VITAMIN D3) 2,000 unit tab Take 2,000 Units by mouth daily.     ??? cyanocobalamin 1,000 mcg tablet Take 1,000 mcg by mouth daily.  Allergies   Allergen Reactions   ??? Levaquin [Levofloxacin] Shortness of Breath     Cipro in the past without adverse effect   ??? Penicillin G Shortness of Breath     Social History     Tobacco Use   ??? Smoking status: Current Every Day Smoker     Packs/day: 0.25     Years: 30.00     Pack years: 7.50     Types: Cigarettes   ??? Smokeless tobacco: Never Used   ??? Tobacco comment: 5-7 cigarettes per day.   Substance Use Topics   ??? Alcohol use: Yes     Alcohol/week: 1.0 standard drinks     Types: 1 Standard drinks or equivalent per week     Frequency: Monthly or less     Drinks per session: 1 or 2     Binge frequency: Never     Comment: social        Review of Systems  Pertinent items are noted in HPI.    Objective:     There were no vitals taken for this visit.   There were no vitals filed for this visit.  As this is a telephone/telehealth visit  No physical obtained. As this is a telephone/telehealth visit    Physical Exam  Constitutional:       General: She is not in acute distress.     Appearance: She is not toxic-appearing.   Pulmonary:      Effort: Pulmonary effort is normal. No respiratory distress.   Neurological:      Mental Status: She is alert.   Psychiatric:         Mood and Affect: Mood normal.         Behavior: Behavior normal.         Thought Content: Thought content normal.         Judgment: Judgment normal.            Assessment/Plan:     Acute cystitis     1. TMP/SMX  2. Maintain adequate hydration  3. May use OTC pyridium as desired, which will turn urine orange/red color  4. Follow up if symptoms not improving, and prn.  5. Probiotic      ICD-10-CM ICD-9-CM    1. Acute cystitis without hematuria N30.00 595.0 trimethoprim-sulfamethoxazole (Bactrim DS) 160-800 mg per tablet     Follow-up and Dispositions    ?? Return if symptoms worsen or fail to improve.     Marland Kitchen

## 2018-08-02 NOTE — Patient Instructions (Signed)
Probiotic (By mouth)   May increase the number of healthy bacteria in your stomach and intestines.  [place] Name(s): 5X Probiotic, Abatinex, Acidophilus Probiotic Blend, Adult Probiotic, Align, BD Lactinex, Bacid, Bacid Probiotic, Bifidonate, BioGaia, BioGaia Gastrus, Culturelle, Culturelle Advanced Immune Defense, Culturelle Digestive Health Probiotic, Culturelle Health & Wellness Probiotic   There may be other [place] names for this medicine.  When This Medicine Should Not Be Used:   This medicine is not right for everyone. Do not use it if you had an allergic reaction to a probiotic, such as acidophilus, bifidobacterium, lactobacillus, saccharomyces, or streptococcus thermophilus.   How to Use This Medicine:   Capsule, Delayed Release Capsule, Liquid, Powder, Tablet, Stick, Spray, Chewable Tablet, Coated Tablet, Wafer  ?? Your doctor will tell you how much medicine to use. Do not use more than directed.  ?? Follow the instructions on the medicine label if you are using this medicine without a prescription.  ?? Tablet or delayed-release capsule: Swallow whole. Do not chew, crush, or break.  ?? Powder:  Be careful to not breathe in the powder, because it may bother your lungs. Do not get the powder on your skin. If you mix the powder in food or liquid, do this right before you take the medicine. Do not mix it and then save it for later.  ?? Chewable tablet or wafer:  Chew it completely before you swallow.  ?? Measure the oral liquid medicine with a marked measuring spoon, oral syringe, or medicine cup.  ?? Missed dose: Take a dose as soon as you remember. If it is almost time for your next dose, wait until then and take a regular dose. Do not take extra medicine to make up for a missed dose.  ?? Some brands must be stored in the refrigerator, and some other brands must be stored at room temperature. Follow the directions on the label. Ask your pharmacist if you are not sure how to store your medicine.   Drugs and Foods to Avoid:      Ask your doctor or pharmacist before using any other medicine, including over-the-counter medicines, vitamins, and herbal products.      Warnings While Using This Medicine:   ?? Different brands of this medicine will have different warnings, because the specific ingredients will be different. Read the label on your medicine carefully. Ask your doctor or pharmacist if you have questions.  ?? Tell your doctor if you are pregnant or breastfeeding, or if you are allergic to milk, lactose, yeast, or soy.  ?? Ask your doctor before you use this medicine if you have a central line (port or catheter), or if you have a fever.  ?? Keep all medicine out of the reach of children. Never share your medicine with anyone.  Possible Side Effects While Using This Medicine:   Call your doctor right away if you notice any of these side effects:  ?? Allergic reaction: Itching or hives, swelling in your face or hands, swelling or tingling in your mouth or throat, chest tightness, trouble breathing  If you notice these less serious side effects, talk with your doctor:   ?? Mild diarrhea, constipation, nausea, or vomiting  ?? Mild gas or cramps  If you notice other side effects that you think are caused by this medicine, tell your doctor.   Call your doctor for medical advice about side effects. You may report side effects to FDA at 1-800-FDA-1088  ?? 2017 [employer] Information is for   End User's use only and may not be sold, redistributed or otherwise used for commercial purposes.  The above information is an educational aid only. It is not intended as medical advice for individual conditions or treatments. Talk to your doctor, nurse or pharmacist before following any medical regimen to see if it is safe and effective for you.  Sulfamethoxazole/Trimethoprim (By mouth)   Sulfamethoxazole (sul-fa-meth-OX-a-zole), Trimethoprim (trye-METH-oh-prim)  Treats or prevents infections.    [place] Name(s): Bactrim, Bactrim DS, SMZ-TMP Pediatric, Sulfatrim, Sulfatrim Pediatric   There may be other [place] names for this medicine.  When This Medicine Should Not Be Used:   This medicine is not right for everyone. Do not use it if you had an allergic reaction to trimethoprim, sulfamethoxazole, or any sulfa drug. Do not use this medicine if you are pregnant, if you have anemia caused by low levels of folic acid, or if you have a history of drug-induced thrombocytopenia.   How to Use This Medicine:   Liquid, Tablet  ?? Your doctor will tell you how much medicine to use. Do not use more than directed.  ?? Measure the oral liquid medicine with a marked measuring spoon, oral syringe, or medicine cup.  ?? Drink extra fluids so you will urinate more often and help prevent kidney problems.  ?? Take all of the medicine in your prescription to clear up your infection, even if you feel better after the first few doses.  ?? Missed dose: Take a dose as soon as you remember. If it is almost time for your next dose, wait until then and take a regular dose. Do not take extra medicine to make up for a missed dose.  ?? Store the medicine in a closed container at room temperature, away from heat, moisture, and direct light. Do not freeze the oral liquid.  Drugs and Foods to Avoid:   Ask your doctor or pharmacist before using any other medicine, including over-the-counter medicines, vitamins, and herbal products.  ?? Some medicines can affect how this medicine works. Tell your doctor if you also use the following:   ?? amantadine, cyclosporine, digoxin, indomethacin, memantine, methotrexate, phenytoin, pyrimethamine, or warfarin  ?? an ACE inhibitor, diabetes medicine (glipizide, glyburide, metformin, pioglitazone, repaglinide, rosiglitazone), a diuretic (water pill, such as hydrochlorothiazide), or a tricyclic antidepressant  Warnings While Using This Medicine:   ?? It is not safe to take this medicine during pregnancy. It could harm an  unborn baby. Tell your doctor right away if you become pregnant.  ?? Tell your doctor if you are breastfeeding, or if you have kidney disease, liver disease, diabetes, malabsorption or malnutrition, folate deficiency, porphyria, thyroid problems, or a history of alcoholism. Tell your doctor if you have asthma or severe allergies, especially if you are allergic to any medicines. It is important for your doctor to know if you have HIV or AIDS, because this medicine might work differently for you.  ?? This medicine may cause a severe allergic reaction.  ?? This medicine may lower the number of platelets in your body, which are necessary for proper blood clotting. This may cause you to bleed or get infections more easily. Talk with your doctor if you have concerns about this.  ?? This medicine can cause diarrhea. Call your doctor if the diarrhea becomes severe, does not stop, or is bloody. Do not take any medicine to stop diarrhea until you have talked to your doctor. Diarrhea can occur 2 months or more after you stop taking this  medicine.  ?? Tell any doctor or dentist who treats you that you are using this medicine. This medicine may affect certain medical test results.  ?? Your doctor will do lab tests at regular visits to check on the effects of this medicine. Keep all appointments.  ?? Keep all medicine out of the reach of children. Never share your medicine with anyone.  Possible Side Effects While Using This Medicine:   Call your doctor right away if you notice any of these side effects:  ?? Allergic reaction: Itching or hives, swelling in your face or hands, swelling or tingling in your mouth or throat, chest tightness, trouble breathing  ?? Blistering, peeling, or red skin rash  ?? Dark urine or pale stools, nausea, vomiting, loss of appetite, stomach pain, yellow skin or eyes  ?? Chest pain, cough, or trouble breathing  ?? Confusion, weakness  ?? Muscle twitching  ?? Severe diarrhea, stomach pain, cramps, bloating   ?? Skin rash, purple spots on your skin, or very pale or yellow skin  ?? Sore throat, fever, muscle pain  ?? Uneven heartbeat, numbness or tingling in your hands, feet, or lips  ?? Unusual bleeding, bruising, or weakness  If you notice these less serious side effects, talk with your doctor:   ?? Mild nausea, vomiting, or loss of appetite  If you notice other side effects that you think are caused by this medicine, tell your doctor.   Call your doctor for medical advice about side effects. You may report side effects to FDA at 1-800-FDA-1088  ?? 2017 [facility] [employer] Information is for End User's use only and may not be sold, redistributed or otherwise used for commercial purposes.  The above information is an educational aid only. It is not intended as medical advice for individual conditions or treatments. Talk to your doctor, nurse or pharmacist before following any medical regimen to see if it is safe and effective for you.       Urinary Tract Infection in Women: Care Instructions  Your Care Instructions    A urinary tract infection, or UTI, is a general term for an infection anywhere between the kidneys and the urethra (where urine comes out). Most UTIs are bladder infections. They often cause pain or burning when you urinate.  UTIs are caused by bacteria and can be cured with antibiotics. Be sure to complete your treatment so that the infection goes away.  Follow-up care is a key part of your treatment and safety. Be sure to make and go to all appointments, and call your doctor if you are having problems. It's also a good idea to know your test results and keep a list of the medicines you take.  How can you care for yourself at home?  ?? Take your antibiotics as directed. Do not stop taking them just because you feel better. You need to take the full course of antibiotics.  ?? Drink extra water and other fluids for the next day or two. This may  help wash out the bacteria that are causing the infection. (If you have kidney, heart, or liver disease and have to limit fluids, talk with your doctor before you increase your fluid intake.)  ?? Avoid drinks that are carbonated or have caffeine. They can irritate the bladder.  ?? Urinate often. Try to empty your bladder each time.  ?? To relieve pain, take a hot bath or lay a heating pad set on low over your lower belly or genital  area. Never go to sleep with a heating pad in place.  To prevent UTIs  ?? Drink plenty of water each day. This helps you urinate often, which clears bacteria from your system. (If you have kidney, heart, or liver disease and have to limit fluids, talk with your doctor before you increase your fluid intake.)  ?? Urinate when you need to.  ?? Urinate right after you have sex.  ?? Change sanitary pads often.  ?? Avoid douches, bubble baths, feminine hygiene sprays, and other feminine hygiene products that have deodorants.  ?? After going to the bathroom, wipe from front to back.  When should you call for help?  Call your doctor now or seek immediate medical care if:  ?? ?? Symptoms such as fever, chills, nausea, or vomiting get worse or appear for the first time.   ?? ?? You have new pain in your back just below your rib cage. This is called flank pain.   ?? ?? There is new blood or pus in your urine.   ?? ?? You have any problems with your antibiotic medicine.   ??Watch closely for changes in your health, and be sure to contact your doctor if:  ?? ?? You are not getting better after taking an antibiotic for 2 days.   ?? ?? Your symptoms go away but then come back.   Where can you learn more?  Go to [employer]  Enter [phone] in the search box to learn more about "Urinary Tract Infection in Women: Care Instructions."  Current as of: August 21, 2019Content Version: 12.4  ?? 2006-2020 [employer].  Care instructions adapted under license by [employer] (which  disclaims liability or warranty for this information). If you have questions about a medical condition or this instruction, always ask your healthcare professional. [employer] disclaims any warranty or liability for your use of this information.

## 2018-08-28 ENCOUNTER — Encounter

## 2018-08-30 MED ORDER — DICLOFENAC 50 MG TAB, DELAYED RELEASE
50 mg | ORAL_TABLET | ORAL | 1 refills | Status: DC
Start: 2018-08-30 — End: 2020-03-29

## 2019-01-26 ENCOUNTER — Encounter

## 2019-03-02 ENCOUNTER — Ambulatory Visit
Admit: 2019-03-02 | Discharge: 2019-03-02 | Payer: PRIVATE HEALTH INSURANCE | Attending: Internal Medicine | Primary: Internal Medicine

## 2019-03-02 ENCOUNTER — Ambulatory Visit: Attending: Internal Medicine | Primary: Internal Medicine

## 2019-03-02 DIAGNOSIS — Z0000 Encounter for general adult medical examination without abnormal findings: Secondary | ICD-10-CM

## 2019-03-02 NOTE — Progress Notes (Signed)
Chief Complaint:   Complete Physical      History of present illness:    Lindsay Liu is a 53 y.o. female with a past medical history as below who comes for annual complete physical exam.   She states that she has basically been feeling healthy however has been under a great deal of stress secondary to the pandemic and her mother's illness.  Her mother is now living with her and suffers from multiple myeloma.  She has had multiple fractures in her bones.    Today we also discussed the Shingrix vaccine.  She will consider getting it at a future date because she cannot have any side effects of fever because she has to bring her mother to the doctor.    She follows up regularly with Dr. Darlin Coco for her thyroid    She continues to smoke daily.  She states with the recent stress she has probably up to more of a half a pack a day rather than 5 cigarettes daily.            3 most recent PHQ Screens 03/02/2019   Little interest or pleasure in doing things Not at all   Feeling down, depressed, irritable, or hopeless Several days   Total Score PHQ 2 1   Trouble falling or staying asleep, or sleeping too much Not at all   Feeling tired or having little energy Not at all   Poor appetite, weight loss, or overeating Not at all   Feeling bad about yourself - or that you are a failure or have let yourself or your family down Not at all   Trouble concentrating on things such as school, work, reading, or watching TV Not at all   Moving or speaking so slowly that other people could have noticed; or the opposite being so fidgety that others notice Not at all   Thoughts of being better off dead, or hurting yourself in some way Not at all   PHQ 9 Score 1   How difficult have these problems made it for you to do your work, take care of your home and get along with others Not difficult at all       CAGE - [place] 03/02/2019   Have you ever felt the need to cut down on your drinking or drug use? No   Have people annoyed you by  criticizing your drinking or drug use? 0   Have you ever felt guilty about drinking or drug use? 0   Have you ever had a drink or used drugs first thing in the morning to steady your nerves or to get rid of a hangover (eye-opener)?  0   CAGE AID Score - >= 2 is considered clinically significant 0       CAGE-AID screening Negative, reviewed.    Review of Systems:   Review of Systems is negative for chest pain, chest pressure, nausea, vomiting, diarrhea, headache, fever, chills, hemoptysis, epistaxis, hematemesis, bright red blood per rectum, hematuria, dysuria, dyspnea, orthopnea, melena, depression, anxiety, or any recent unexplained weight loss or weight gain except as mentioned in HPI.      Prior to Admission medications    Medication Sig Start Date End Date Taking? Authorizing Provider   tiZANidine (ZANAFLEX) 2 mg tablet TAKE 1 TABLET BY MOUTH EVERY 8 HOURS AS NEEDED 02/07/19  Yes Provider, Historical   diclofenac EC (VOLTAREN) 50 mg EC tablet TAKE 1 TABLET BY MOUTH TWICE A DAY  Patient taking  differently: daily as needed. 08/30/18  Yes Domingo Mend, MD   Lactobacillus acidophilus (PROBIOTIC PO) Take  by mouth daily.   Yes Provider, Historical   cetirizine HCl (ZYRTEC PO) Take  by mouth as needed.   Yes Provider, Historical            Allergies   Allergen Reactions   ??? Levaquin [Levofloxacin] Shortness of Breath     Cipro in the past without adverse effect   ??? Penicillin G Shortness of Breath           HISTORY:  Past Medical History:   Diagnosis Date   ??? Cervical spine pain     Oh   ??? Chronic urticaria    ??? Endometriosis    ??? Hypercholesterolemia    ??? Stye 10/2016    Dr Otho Perl -> [facility]   ??? Thyroid disease        Past Surgical History:   Procedure Laterality Date   ??? HX APPENDECTOMY     ??? HX CHOLECYSTECTOMY  01/1994   ??? HX LAPAROTOMY         Family History   Problem Relation Age of Onset   ??? Elevated Lipids Mother    ??? Heart Disease Mother    ??? Cancer Mother 82        multiple myeloma   ??? Heart Disease  Father    ??? No Known Problems Daughter    ??? No Known Problems Son    ??? No Known Problems Sister    ??? Elevated Lipids Brother    ??? Elevated Lipids Brother        Social History     Tobacco Use   ??? Smoking status: Current Every Day Smoker     Packs/day: 0.50     Years: 30.00     Pack years: 15.00     Types: Cigarettes   ??? Smokeless tobacco: Never Used   Substance Use Topics   ??? Alcohol use: Yes     Alcohol/week: 1.0 standard drinks     Types: 1 Standard drinks or equivalent per week     Frequency: Monthly or less     Drinks per session: 1 or 2     Binge frequency: Never     Comment: social   ??? Drug use: No       Immunization History   Administered Date(s) Administered   ??? Influenza Vaccine (>6 mo Afluria QUAD Vial R507508 (0.25 mL) / [id number] (0.5 mL)) 01/05/2015, 01/14/2017   ??? Influenza Vaccine (Quadrivalent)(>18 Yrs Flublok [id number]) 01/28/2018, 03/02/2019   ??? Influenza Vaccine PF 02/24/2013   ??? Pneumococcal Polysaccharide (PPSV-23) 01/14/2017   ??? Tdap 01/05/2015       Patient Care Team:  Domingo Mend, MD as PCP - General (Internal Medicine)  Domingo Mend, MD as PCP - [facility] Empaneled Provider  Oh, Percival Spanish, MD (Orthopedic Surgery)  Everardo Pacific, NP  Lujean Amel, MD (Gastroenterology)  Comer Locket, MD (Ophthalmology)  Michele Mcalpine, MD (Obstetrics & Gynecology)  Darlin Coco, MD (Endocrinology)      Objective:   Vitals:          Vitals:    03/02/19 1000   BP: 104/72   Pulse: 87   Resp: 14   Temp: (!) 96.6 ??F (35.9 ??C)   TempSrc: Temporal   SpO2: 98%   Weight: 155 lb (70.3 kg)   Height: 5' 3.75" (1.619 m)       PHYSICAL EXAM:  General:    Alert, cooperative, no distress, appears stated age.     HEENT: Atraumatic, anicteric sclerae, pink conjunctivae     No oral ulcers, mucosa moist, throat clear  Neck:  Supple, symmetrical,  thyroid: non tender  Back:    No CVA tenderness.  Lungs:   Clear to auscultation bilaterally.  No Wheezing or Rhonchi. No rales.  Chest wall:  No tenderness  No Accessory muscle  use.  Heart:   Regular  rhythm,  No  murmur   No gallop.  No edema  Abdomen:   Soft, non-tender. Not distended.  Bowel sounds normal. No masses  Extremities: No cyanosis.  No clubbing No edema  Skin:     Not pale Not Jaundiced  No rashes   Lymph nodes: Cervical, supraclavicular normal.  Psych:  Good insight.  Not depressed.  Not anxious or agitated.  Neurologic: EOMs intact. No facial asymmetry. No aphasia or slurred speech. Normal   strength, Alert and oriented X 3.   Breasts: breasts appear normal, no suspicious masses, no skin or nipple changes or axillary nodes.    Lab Data Reviewed:   Patient to go for fasting blood work      EKG No results found for any visits on 03/02/19.      Health Maintenance   Topic Date Due   ??? Shingrix Vaccine Age 34> (1 of 2) 02/22/2014   ??? Flu Vaccine (1) 12/14/2018   ??? Breast Cancer Screen Mammogram  02/13/2020   ??? PAP AKA CERVICAL CYTOLOGY  03/19/2020   ??? Colorectal Cancer Screening Combo  03/20/2020   ??? Lipid Screen  01/19/2023   ??? DTaP/Tdap/Td series (2 - Td) 01/04/2025   ??? Bone Densitometry (Dexa) Screening  02/22/2029   ??? Hepatitis C Screening  Completed   ??? Pneumococcal 0-64 years  Completed         Assessment/Plan:   1. Well woman exam (no gynecological exam)  Patient will consider Shingrix.  Prescription given for mammography.  Also to go for fasting blood work.    2. Encounter for immunization     - [place], QUADRIVALENT (RIV4), DERIVED FROM RECOMBINANT DNA,HEMAGGLUTININ(HA) PROTEIN ONLY, PF ABX FREE    3. Screening mammogram, encounter for     - MAM 3D TOMO W MAMMO BI SCREENING INCL CAD; Future    4. Current every day smoker   The patient was counseled on the dangers of tobacco use, and was advised to quit and reluctant to quit.  Reviewed strategies to maximize success, including removing cigarettes and smoking materials from environment, stress management and support of family/friends.      5. Hyperlipidemia, unspecified hyperlipidemia type  Discussed with  patient if lipids are still elevated will start lowest dose of medication every other day.  I.e. rosuvastatin 5 mg every other day      The patient is advised to quit smoking, begin progressive daily aerobic exercise program, follow a low fat, low cholesterol diet, attempt to lose weight, reduce exposure to stress, improve dietary compliance and return for routine annual checkups.  Discussed the patient's I have reviewed/discussed the above normal BMI with the patient.  I have recommended the following interventions: encourage exercise . Diet discussed     Care Plan discussed with:   [x] Patient   [] Family    [] Care Manager    [] Nursing   [] Consultant/Specialist :   [x]   >50% of visit spent in counseling and coordination of care  Discussed     [] Advanced Directives,  [x]  smoking cessation   [x]  diet   [x] excercise   [x] weight loss recommended  [] seat belts  [x] sun screen usage  [] safe sex practices  [  ] texting while driving  [  ]cell phone use while driving[ ]  medication usage, compliance, and side effects          Follow up in 1 year        Note: This report was transcribed using voice recognition software and is thus   prone to syntax and contextual spelling errors. Please do not hesitate to call   me if anything needs clarification or a dictated addendum         ___________________________________________________     Domingo Mend, MD     ___________________________________________________

## 2019-03-02 NOTE — Patient Instructions (Signed)
Shingrix  The new shingles shot is recombinant vaccine given in 2 doses.  Doses must be separated by 2-6 months.  You can expect a local reaction at the site of the injection which is common but not an allergy.  You may also expect to feel some flulike symptoms for 1-2 days afterwards.  Again this is not an allergic reaction but an expected reaction from the injection.    Studies have shown that this vaccine series will give you more long-lasting immunity to prevent a shingles infection which can be quite painful.  The pain of the shingles infection can last beyond the duration of the rash.    Current recommendations recommend that even if you had the previous Zostavax that you also have the new Shingrix vaccine as this Zostavax immunity will decrease over time.    The previous Zostavax was a live vaccine and could not be given to immunocompromised patients or 2 people who would be around on immunized contacts such as young grandchildren.  The new vaccine is recombinant vaccine which can be given to anyone.  Current recommendations are that it can be given to anyone age 50 and above.    Insurance coverage for this vaccine is varied.  Please check your insurance including your prescription plan to see whether it is better covered in our office or at the pharmacy.    The vaccine can be given in either office, [place] or [place], or you can receive it at the pharmacy based upon which location would cost you less with your insurance.

## 2019-03-02 NOTE — Progress Notes (Signed)
Chief Complaint:   Complete Physical      History of present illness:    Lindsay Liu is a 53 y.o. female with a past medical history as below who comes for annual complete physical exam.   She states that she has basically been feeling healthy however has been under a great deal of stress secondary to the pandemic and her mother's illness.  Her mother is now living with her and suffers from multiple myeloma.  She has had multiple fractures in her bones.    Today we also discussed the Shingrix vaccine.  She will consider getting it at a future date because she cannot have any side effects of fever because she has to bring her mother to the doctor.    She follows up regularly with Dr. Darlin Coco for her thyroid    She continues to smoke daily.  She states with the recent stress she has probably up to more of a half a pack a day rather than 5 cigarettes daily.            3 most recent PHQ Screens 03/02/2019   Little interest or pleasure in doing things Not at all   Feeling down, depressed, irritable, or hopeless Several days   Total Score PHQ 2 1   Trouble falling or staying asleep, or sleeping too much Not at all   Feeling tired or having little energy Not at all   Poor appetite, weight loss, or overeating Not at all   Feeling bad about yourself - or that you are a failure or have let yourself or your family down Not at all   Trouble concentrating on things such as school, work, reading, or watching TV Not at all   Moving or speaking so slowly that other people could have noticed; or the opposite being so fidgety that others notice Not at all   Thoughts of being better off dead, or hurting yourself in some way Not at all   PHQ 9 Score 1   How difficult have these problems made it for you to do your work, take care of your home and get along with others Not difficult at all       CAGE - [place] 03/02/2019   Have you ever felt the need to cut down on your drinking or drug use? No    Have people annoyed you by criticizing your drinking or drug use? 0   Have you ever felt guilty about drinking or drug use? 0   Have you ever had a drink or used drugs first thing in the morning to steady your nerves or to get rid of a hangover (eye-opener)?  0   CAGE AID Score - >= 2 is considered clinically significant 0       CAGE-AID screening Negative, reviewed.    Review of Systems:   Review of Systems is negative for chest pain, chest pressure, nausea, vomiting, diarrhea, headache, fever, chills, hemoptysis, epistaxis, hematemesis, bright red blood per rectum, hematuria, dysuria, dyspnea, orthopnea, melena, depression, anxiety, or any recent unexplained weight loss or weight gain except as mentioned in HPI.      Prior to Admission medications    Medication Sig Start Date End Date Taking? Authorizing Provider   tiZANidine (ZANAFLEX) 2 mg tablet TAKE 1 TABLET BY MOUTH EVERY 8 HOURS AS NEEDED 02/07/19  Yes Provider, Historical   diclofenac EC (VOLTAREN) 50 mg EC tablet TAKE 1 TABLET BY MOUTH TWICE A DAY  Patient taking  differently: daily as needed. 08/30/18  Yes Domingo Mend, MD   Lactobacillus acidophilus (PROBIOTIC PO) Take  by mouth daily.   Yes Provider, Historical   cetirizine HCl (ZYRTEC PO) Take  by mouth as needed.   Yes Provider, Historical            Allergies   Allergen Reactions   ??? Levaquin [Levofloxacin] Shortness of Breath     Cipro in the past without adverse effect   ??? Penicillin G Shortness of Breath           HISTORY:  Past Medical History:   Diagnosis Date   ??? Cervical spine pain     Oh   ??? Chronic urticaria    ??? Endometriosis    ??? Hypercholesterolemia    ??? Stye 10/2016    Dr Otho Perl -> [facility]   ??? Thyroid disease        Past Surgical History:   Procedure Laterality Date   ??? HX APPENDECTOMY     ??? HX CHOLECYSTECTOMY  01/1994   ??? HX LAPAROTOMY         Family History   Problem Relation Age of Onset   ??? Elevated Lipids Mother    ??? Heart Disease Mother    ??? Cancer Mother 62         multiple myeloma   ??? Heart Disease Father    ??? No Known Problems Daughter    ??? No Known Problems Son    ??? No Known Problems Sister    ??? Elevated Lipids Brother    ??? Elevated Lipids Brother        Social History     Tobacco Use   ??? Smoking status: Current Every Day Smoker     Packs/day: 0.50     Years: 30.00     Pack years: 15.00     Types: Cigarettes   ??? Smokeless tobacco: Never Used   Substance Use Topics   ??? Alcohol use: Yes     Alcohol/week: 1.0 standard drinks     Types: 1 Standard drinks or equivalent per week     Frequency: Monthly or less     Drinks per session: 1 or 2     Binge frequency: Never     Comment: social   ??? Drug use: No       Immunization History   Administered Date(s) Administered   ??? Influenza Vaccine (>6 mo Afluria QUAD Vial R507508 (0.25 mL) / [id number] (0.5 mL)) 01/05/2015, 01/14/2017   ??? Influenza Vaccine (Quadrivalent)(>18 Yrs Flublok 34742) 01/28/2018, 03/02/2019   ??? Influenza Vaccine PF 02/24/2013   ??? Pneumococcal Polysaccharide (PPSV-23) 01/14/2017   ??? Tdap 01/05/2015       Patient Care Team:  Domingo Mend, MD as PCP - General (Internal Medicine)  Domingo Mend, MD as PCP - [facility] Empaneled Provider  Oh, Percival Spanish, MD (Orthopedic Surgery)  Everardo Pacific, NP  Lujean Amel, MD (Gastroenterology)  Comer Locket, MD (Ophthalmology)  Michele Mcalpine, MD (Obstetrics & Gynecology)  Darlin Coco, MD (Endocrinology)      Objective:   Vitals:          Vitals:    03/02/19 1000   BP: 104/72   Pulse: 87   Resp: 14   Temp: (!) 96.6 ??F (35.9 ??C)   TempSrc: Temporal   SpO2: 98%   Weight: 155 lb (70.3 kg)   Height: 5' 3.75" (1.619 m)       PHYSICAL EXAM:  General:    Alert, cooperative, no distress, appears stated age.     HEENT: Atraumatic, anicteric sclerae, pink conjunctivae     No oral ulcers, mucosa moist, throat clear  Neck:  Supple, symmetrical,  thyroid: non tender  Back:    No CVA tenderness.  Lungs:   Clear to auscultation bilaterally.  No Wheezing or Rhonchi. No rales.   Chest wall:  No tenderness  No Accessory muscle use.  Heart:   Regular  rhythm,  No  murmur   No gallop.  No edema  Abdomen:   Soft, non-tender. Not distended.  Bowel sounds normal. No masses  Extremities: No cyanosis.  No clubbing No edema  Skin:     Not pale Not Jaundiced  No rashes   Lymph nodes: Cervical, supraclavicular normal.  Psych:  Good insight.  Not depressed.  Not anxious or agitated.  Neurologic: EOMs intact. No facial asymmetry. No aphasia or slurred speech. Normal   strength, Alert and oriented X 3.   Breasts: breasts appear normal, no suspicious masses, no skin or nipple changes or axillary nodes.    Lab Data Reviewed:   Patient to go for fasting blood work      EKG No results found for any visits on 03/02/19.      Health Maintenance   Topic Date Due   ??? Shingrix Vaccine Age 79> (1 of 2) 02/22/2014   ??? Flu Vaccine (1) 12/14/2018   ??? Breast Cancer Screen Mammogram  02/13/2020   ??? PAP AKA CERVICAL CYTOLOGY  03/19/2020   ??? Colorectal Cancer Screening Combo  03/20/2020   ??? Lipid Screen  01/19/2023   ??? DTaP/Tdap/Td series (2 - Td) 01/04/2025   ??? Bone Densitometry (Dexa) Screening  02/22/2029   ??? Hepatitis C Screening  Completed   ??? Pneumococcal 0-64 years  Completed         Assessment/Plan:   1. Well woman exam (no gynecological exam)  Patient will consider Shingrix.  Prescription given for mammography.  Also to go for fasting blood work.    2. Encounter for immunization     - [place], QUADRIVALENT (RIV4), DERIVED FROM RECOMBINANT DNA,HEMAGGLUTININ(HA) PROTEIN ONLY, PF ABX FREE    3. Screening mammogram, encounter for     - MAM 3D TOMO W MAMMO BI SCREENING INCL CAD; Future    4. Current every day smoker   The patient was counseled on the dangers of tobacco use, and was advised to quit and reluctant to quit.  Reviewed strategies to maximize success, including removing cigarettes and smoking materials from environment, stress management and support of family/friends.       5. Hyperlipidemia, unspecified hyperlipidemia type  Discussed with patient if lipids are still elevated will start lowest dose of medication every other day.  I.e. rosuvastatin 5 mg every other day      The patient is advised to quit smoking, begin progressive daily aerobic exercise program, follow a low fat, low cholesterol diet, attempt to lose weight, reduce exposure to stress, improve dietary compliance and return for routine annual checkups.  Discussed the patient's I have reviewed/discussed the above normal BMI with the patient.  I have recommended the following interventions: encourage exercise . Diet discussed     Care Plan discussed with:   '[x]'$ Patient   '[]'$ Family    '[]'$ Care Manager    '[]'$ Nursing   '[]'$ Consultant/Specialist :   '[x]'$   >50% of visit spent in counseling and coordination of care  Discussed     [] Advanced Directives,  [x]  smoking cessation   [x]  diet   [x] excercise   [x] weight loss recommended  [] seat belts  [x] sun screen usage  [] safe sex practices  [  ] texting while driving  [  ]cell phone use while driving[ ]  medication usage, compliance, and side effects          Follow up in 1 year        Note: This report was transcribed using voice recognition software and is thus   prone to syntax and contextual spelling errors. Please do not hesitate to call   me if anything needs clarification or a dictated addendum         ___________________________________________________     Domingo Mend, MD     ___________________________________________________

## 2019-03-07 ENCOUNTER — Encounter

## 2019-03-07 NOTE — Progress Notes (Signed)
Spoke with pt

## 2019-03-07 NOTE — Progress Notes (Signed)
Please tell patient that mammography is normal.  Repeat mammography in one year

## 2019-03-14 NOTE — Progress Notes (Signed)
L/m for pt to call office back. FI

## 2019-03-14 NOTE — Progress Notes (Signed)
labs are normal except for vit D def. Please  take otc vit D 2000iu daily.  Also cholesterol is elevated however mildly improved since last check.  Continue to watch diet and we will recheck again next year

## 2019-03-14 NOTE — Progress Notes (Signed)
Patient advised

## 2019-03-14 NOTE — Progress Notes (Signed)
labs are normal except for vit D def. Please  take otc vit D 2000iu daily.  Also cholesterol is elevated however mildly improved since last check.  Continue to watch diet and we will recheck again next year

## 2019-03-15 LAB — URINALYSIS W/ RFLX MICROSCOPIC
Bilirubin, Urine: NEGATIVE
Bilirubin: NEGATIVE
Glucose, Ur: NEGATIVE
Glucose: NEGATIVE
Hyaline Casts, UA: NONE SEEN /LPF
Hyaline cast: NONE SEEN /LPF
Ketone: NEGATIVE
Ketones, Urine: NEGATIVE
Leukocyte Esterase, Urine: NEGATIVE
Leukocyte Esterase: NEGATIVE
Nitrite, Urine: NEGATIVE
Nitrites: NEGATIVE
Protein, UA: NEGATIVE
Protein: NEGATIVE
SQUAMOUS EPITHELIAL CELLS: NONE SEEN /HPF (ref <=5)
Specific Gravity, UA: 1.023 NA (ref 1.001–1.035)
Specific gravity: 1.023 (ref 1.001–1.035)
Squamous Epithelial Cells: NONE SEEN /HPF (ref <=5)
pH (UA): 5.5 (ref 5.0–8.0)
pH, UA: 5.5 NA (ref 5.0–8.0)

## 2019-03-15 LAB — CBC WITH AUTOMATED DIFF
ABS. BASOPHILS: 41 cells/uL (ref 0–200)
ABS. EOSINOPHILS: 58 cells/uL (ref 15–500)
ABS. LYMPHOCYTES: 2105 cells/uL (ref 850–3900)
ABS. MONOCYTES: 360 cells/uL (ref 200–950)
ABS. NEUTROPHILS: 3236 cells/uL (ref 1500–7800)
BASOPHILS: 0.7 % (ref 0–2)
EOSINOPHILS: 1 % (ref 0–8)
HCT: 43 % (ref 35.0–45.0)
HGB: 14.1 g/dL (ref 11.7–15.5)
LYMPHOCYTES: 36.3 % (ref 15–49)
MCH: 32.2 pg (ref 27.0–33.0)
MCHC: 32.8 g/dL (ref 32.0–36.0)
MCV: 98.2 fL (ref 80.0–100.0)
MEAN PLATELET VOLUME: 11.4 fL (ref 7.5–12.5)
MONOCYTES: 6.2 % (ref 0–13)
Neutrophils: 55.8 % (ref 38–80)
PLATELET: 241 10*3/uL (ref 140–400)
RBC: 4.38 10*6/uL (ref 3.80–5.10)
RDW: 12.4 % (ref 11.0–15.0)
WBC: 5.8 10*3/uL (ref 3.8–10.8)

## 2019-03-15 LAB — METABOLIC PANEL, COMPREHENSIVE
ALB/GLOBRATIO: 2 (calc) (ref 1.0–2.5)
ALT (SGPT): 14 U/L (ref 6–29)
AST (SGOT): 16 U/L (ref 10–35)
Albumin: 4.2 g/dL (ref 3.6–5.1)
Alkaline Phosphatase, total: 66 U/L (ref 37–153)
BUN: 17 mg/dL (ref 7–25)
Bilirubin, total: 0.4 mg/dL (ref 0.2–1.2)
CO2: 24 mmol/L (ref 20–32)
Calcium: 9.6 mg/dL (ref 8.6–10.4)
Chloride: 108 mmol/L (ref 98–110)
Creatinine: 0.77 mg/dL (ref 0.50–1.05)
GFR est AA: 101 mL/min/{1.73_m2} (ref >=60)
GFR est non-AA: 87 mL/min/{1.73_m2} (ref >=60)
Globulin: 2.1 g/dL (calc) (ref 1.9–3.7)
Glucose: 90 mg/dL (ref 65–99)
Potassium: 4.4 mmol/L (ref 3.5–5.3)
Protein, total: 6.3 g/dL (ref 6.1–8.1)
Sodium: 142 mmol/L (ref 135–146)

## 2019-03-15 LAB — TSH REFLEX TO T4F
TSH W/REFLEX TO FT4: 0.77 mIU/L
TSH W/REFLEX TO FT4: 0.77 mIU/L

## 2019-03-15 LAB — LIPID PANEL
Chol/HDL Ratio: 4.1 (calc) (ref <5.0)
Cholesterol, Total: 232 mg/dL — ABNORMAL HIGH (ref <200)
Cholesterol, total: 232 mg/dL — ABNORMAL HIGH (ref <200)
Cholesterol/HDL ratio: 4.1 (calc) (ref <5.0)
HDL Cholesterol: 56 mg/dL (ref >=50)
HDL: 56 mg/dL (ref >=50)
LDL Cholesterol: 158 mg/dL (calc) — ABNORMAL HIGH
LDL-CHOLESTEROL: 158 mg/dL (calc) — ABNORMAL HIGH
Non-HDL Cholesterol: 176 mg/dL (calc) — ABNORMAL HIGH (ref <130)
Non-HDL Cholesterol: 176 mg/dL (calc) — ABNORMAL HIGH (ref <130)
Triglyceride: 75 mg/dL (ref <150)
Triglycerides: 75 mg/dL (ref <150)

## 2019-03-15 LAB — FERRITIN
Ferritin: 64 ng/mL (ref 16–232)
Ferritin: 64 ng/mL (ref 16–232)

## 2019-03-15 LAB — VITAMIN B12
Vitamin B-12: 584 pg/mL (ref 200–1100)
Vitamin B12: 584 pg/mL (ref 200–1100)

## 2019-03-15 LAB — VITAMIN D, 25 HYDROXY: VITAMIN D, 25-HYDROXY: 25 ng/mL — ABNORMAL LOW (ref 30–100)

## 2019-03-15 LAB — CBC WITH AUTO DIFFERENTIAL
Basophils %: 0.7 % (ref 0–2)
Basophils Absolute: 41 cells/uL (ref 0–200)
Eosinophils %: 1 % (ref 0–8)
Eosinophils Absolute: 58 cells/uL (ref 15–500)
Hematocrit: 43 % (ref 35.0–45.0)
Hemoglobin: 14.1 g/dL (ref 11.7–15.5)
Lymphocytes %: 36.3 % (ref 15–49)
Lymphocytes Absolute: 2105 cells/uL (ref 850–3900)
MCH: 32.2 pg (ref 27.0–33.0)
MCHC: 32.8 g/dL (ref 32.0–36.0)
MCV: 98.2 fL (ref 80.0–100.0)
MPV: 11.4 fL (ref 7.5–12.5)
Monocytes %: 6.2 % (ref 0–13)
Monocytes Absolute: 360 cells/uL (ref 200–950)
Neutrophils %: 55.8 % (ref 38–80)
Neutrophils Absolute: 3236 cells/uL (ref 1500–7800)
Platelets: 241 10*3/uL (ref 140–400)
RBC: 4.38 10*6/uL (ref 3.80–5.10)
RDW: 12.4 % (ref 11.0–15.0)
WBC: 5.8 10*3/uL (ref 3.8–10.8)

## 2019-03-15 LAB — COMPREHENSIVE METABOLIC PANEL
ALT: 14 U/L (ref 6–29)
AST: 16 U/L (ref 10–35)
Albumin/Globulin Ratio: 2 (calc) (ref 1.0–2.5)
Albumin: 4.2 g/dL (ref 3.6–5.1)
Alkaline Phosphatase: 66 U/L (ref 37–153)
BUN: 17 mg/dL (ref 7–25)
CO2: 24 mmol/L (ref 20–32)
Calcium: 9.6 mg/dL (ref 8.6–10.4)
Chloride: 108 mmol/L (ref 98–110)
Creatinine: 0.77 mg/dL (ref 0.50–1.05)
EGFR IF NonAfrican American: 87 mL/min/{1.73_m2} (ref >=60)
GFR African American: 101 mL/min/{1.73_m2} (ref >=60)
Globulin: 2.1 g/dL (calc) (ref 1.9–3.7)
Glucose: 90 mg/dL (ref 65–99)
Potassium: 4.4 mmol/L (ref 3.5–5.3)
Sodium: 142 mmol/L (ref 135–146)
Total Bilirubin: 0.4 mg/dL (ref 0.2–1.2)
Total Protein: 6.3 g/dL (ref 6.1–8.1)

## 2019-03-15 LAB — VITAMIN D 25 HYDROXY: Vit D, 25-Hydroxy: 25 ng/mL — ABNORMAL LOW (ref 30–100)

## 2019-07-02 ENCOUNTER — Ambulatory Visit
Admit: 2019-07-02 | Discharge: 2019-07-02 | Payer: PRIVATE HEALTH INSURANCE | Attending: Internal Medicine | Primary: Internal Medicine

## 2019-07-02 ENCOUNTER — Ambulatory Visit: Attending: Internal Medicine | Primary: Internal Medicine

## 2019-07-02 DIAGNOSIS — N3090 Cystitis, unspecified without hematuria: Secondary | ICD-10-CM

## 2019-07-02 LAB — AMB POC URINE DIP STICK MANUAL W/O MICRO CHEMSTRIP-10
Bilirubin (UA POC): NEGATIVE
Bilirubin, Urine, POC: NEGATIVE
Glucose (UA POC): NEGATIVE mg/dL
Glucose, Urine, POC: NEGATIVE mg/dL
Ketones (UA POC): NEGATIVE
Ketones, Urine, POC: NEGATIVE
Leukocyte Esterase, Urine, POC: NEGATIVE
Leukocyte esterase (UA POC): NEGATIVE
Nitrite, Urine, POC: NEGATIVE
Nitrites (UA POC): NEGATIVE
Specific Gravity, Urine, POC: 1.005 NA (ref 1.001–1.030)
Specific gravity (UA POC): 1.005 (ref 1.001–1.030)
Urobilinogen (UA POC): NORMAL mg/dL (ref ?–1)
Urobilinogen, POC: NORMAL mg/dL (ref ?–1)
pH (UA POC): 5 (ref 5–9)
pH, Urine, POC: 5 NA (ref 5–9)

## 2019-07-02 MED ORDER — NITROFURANTOIN (25% MACROCRYSTAL FORM) 100 MG CAP
100 mg | ORAL_CAPSULE | Freq: Two times a day (BID) | ORAL | 0 refills | Status: AC
Start: 2019-07-02 — End: 2019-07-09

## 2019-07-02 NOTE — Progress Notes (Signed)
Urine culture shows only a few bacteria.  Are symptoms better?  If not would consider urology evaluation

## 2019-07-02 NOTE — Progress Notes (Signed)
Chief Complaint:  53 y.o.female   Urinary Frequency      HPI:  Lindsay Liu is a 53 y.o. female who complains of urinary frequency, urgency and dysuria x 5 days, without flank pain, fever, chills, or abnormal vaginal discharge or bleeding.     Pt does not have a h/o frequent UTIs.    ROS: Pertinent issues noted in HPI    Allergies   Allergen Reactions   ??? Levaquin [Levofloxacin] Shortness of Breath     Cipro in the past without adverse effect   ??? Penicillin G Shortness of Breath     Outpatient Medications Marked as Taking for the 07/02/19 encounter (Office Visit) with Alphons Burgert A, MD   Medication Sig Dispense Refill   ??? nitrofurantoin, macrocrystal-monohydrate, (MACROBID) 100 mg capsule Take 1 Cap by mouth two (2) times a day for 7 days. 14 Cap 0   ??? tiZANidine (ZANAFLEX) 2 mg tablet TAKE 1 TABLET BY MOUTH EVERY 8 HOURS AS NEEDED     ??? diclofenac EC (VOLTAREN) 50 mg EC tablet TAKE 1 TABLET BY MOUTH TWICE A DAY (Patient taking differently: daily as needed.) 60 Tab 1   ??? Lactobacillus acidophilus (PROBIOTIC PO) Take  by mouth daily.     ??? cetirizine HCl (ZYRTEC PO) Take  by mouth as needed.               OBJECTIVE:   Visit Vitals  BP 132/80 (BP 1 Location: Left upper arm, BP Patient Position: Sitting, BP Cuff Size: Adult)   Pulse 82   Temp 96.9 ??F (36.1 ??C) (Temporal)   Resp 14   Ht 5' 3" (1.6 m)   Wt 155 lb (70.3 kg)   SpO2 99%   BMI 27.46 kg/m??       Appears well, NAD.  Vital signs are as noted. The abdomen is soft with evidence of suprapubic discomfort. There is no guarding, mass, rebound or  CVA tenderness.  S1S2 RRR, Lungs are clear.    Results for orders placed or performed in visit on 07/02/19   AMB POC URINE DIP STICK MANUAL W/O MICRO CHEMSTRIP-10   Result Value Ref Range    Color (UA POC) Light Yellow     Clarity (UA POC) Slightly Cloudy     Specific gravity (UA POC) 1.005 1.001 - 1.030    pH (UA POC) 5 5 - 9    Leukocyte esterase (UA POC) Negative Negative    Nitrites (UA POC) Negative Negative     Protein (UA POC) Trace Negative    Glucose (UA POC) Negative Negative mg/dL    Ketones (UA POC) Negative Negative    Urobilinogen (UA POC) normal Normal - 1 mg/dL    Bilirubin (UA POC) Negative Negative    Blood (UA POC) about 50 Negative ery/uL       ASSESSMENT/PLAN:  Diagnoses and all orders for this visit:    1. Cystitis  -     AMB POC URINE DIP STICK MANUAL W/O MICRO CHEMSTRIP-10  -     CULTURE, URINE; Future  -     URINALYSIS W/ RFLX MICROSCOPIC; Future  -     nitrofurantoin, macrocrystal-monohydrate, (MACROBID) 100 mg capsule; Take 1 Cap by mouth two (2) times a day for 7 days.        Reviewed measures to help prevent UTI's including personal hygiene techniques and post coital voiding. Pt reminded to drink extra hydrating fluids including cranberry juice. May use Pyridium OTC prn. Call or   return if  symptoms worsen or fail to improve as anticipated.    Pricilla Handler, MD  07/02/19

## 2019-07-02 NOTE — Progress Notes (Signed)
Urine culture shows only a few bacteria.  Are symptoms better?  If not would consider urology evaluation

## 2019-07-02 NOTE — Progress Notes (Signed)
Spoke with pt

## 2019-07-02 NOTE — Progress Notes (Signed)
Chief Complaint:  54 y.o.female   Urinary Frequency      HPI:  Lindsay Liu is a 54 y.o. female who complains of urinary frequency, urgency and dysuria x 5 days, without flank pain, fever, chills, or abnormal vaginal discharge or bleeding.     Pt does not have a h/o frequent UTIs.    ROS: Pertinent issues noted in HPI    Allergies   Allergen Reactions   ??? Levaquin [Levofloxacin] Shortness of Breath     Cipro in the past without adverse effect   ??? Penicillin G Shortness of Breath     Outpatient Medications Marked as Taking for the 07/02/19 encounter (Office Visit) with Domingo Mend, MD   Medication Sig Dispense Refill   ??? nitrofurantoin, macrocrystal-monohydrate, (MACROBID) 100 mg capsule Take 1 Cap by mouth two (2) times a day for 7 days. 14 Cap 0   ??? tiZANidine (ZANAFLEX) 2 mg tablet TAKE 1 TABLET BY MOUTH EVERY 8 HOURS AS NEEDED     ??? diclofenac EC (VOLTAREN) 50 mg EC tablet TAKE 1 TABLET BY MOUTH TWICE A DAY (Patient taking differently: daily as needed.) 60 Tab 1   ??? Lactobacillus acidophilus (PROBIOTIC PO) Take  by mouth daily.     ??? cetirizine HCl (ZYRTEC PO) Take  by mouth as needed.               OBJECTIVE:   Visit Vitals  BP 132/80 (BP 1 Location: Left upper arm, BP Patient Position: Sitting, BP Cuff Size: Adult)   Pulse 82   Temp 96.9 ??F (36.1 ??C) (Temporal)   Resp 14   Ht 5\' 3"  (1.6 m)   Wt 155 lb (70.3 kg)   SpO2 99%   BMI 27.46 kg/m??       Appears well, NAD.  Vital signs are as noted. The abdomen is soft with evidence of suprapubic discomfort. There is no guarding, mass, rebound or  CVA tenderness.  S1S2 RRR, Lungs are clear.    Results for orders placed or performed in visit on 07/02/19   AMB POC URINE DIP STICK MANUAL W/O MICRO CHEMSTRIP-10   Result Value Ref Range    Color (UA POC) Light Yellow     Clarity (UA POC) Slightly Cloudy     Specific gravity (UA POC) 1.005 1.001 - 1.030    pH (UA POC) 5 5 - 9    Leukocyte esterase (UA POC) Negative Negative    Nitrites (UA POC) Negative Negative     Protein (UA POC) Trace Negative    Glucose (UA POC) Negative Negative mg/dL    Ketones (UA POC) Negative Negative    Urobilinogen (UA POC) normal Normal - 1 mg/dL    Bilirubin (UA POC) Negative Negative    Blood (UA POC) about 50 Negative ery/uL       ASSESSMENT/PLAN:  Diagnoses and all orders for this visit:    1. Cystitis  -     AMB POC URINE DIP STICK MANUAL W/O MICRO CHEMSTRIP-10  -     CULTURE, URINE; Future  -     URINALYSIS W/ RFLX MICROSCOPIC; Future  -     nitrofurantoin, macrocrystal-monohydrate, (MACROBID) 100 mg capsule; Take 1 Cap by mouth two (2) times a day for 7 days.        Reviewed measures to help prevent UTI's including personal hygiene techniques and post coital voiding. Pt reminded to drink extra hydrating fluids including cranberry juice. May use Pyridium OTC prn. Call or  return if  symptoms worsen or fail to improve as anticipated.    Pricilla Handler, MD  07/02/19

## 2019-07-04 LAB — URINALYSIS W/ RFLX MICROSCOPIC
BACTERIA, URINE: NONE SEEN /HPF
Bacteria: NONE SEEN /HPF
Bilirubin, Urine: NEGATIVE
Bilirubin: NEGATIVE
Glucose, Ur: NEGATIVE
Glucose: NEGATIVE
Hyaline Casts, UA: NONE SEEN /LPF
Hyaline cast: NONE SEEN /LPF
Ketone: NEGATIVE
Ketones, Urine: NEGATIVE
Nitrite, Urine: NEGATIVE
Nitrites: NEGATIVE
Protein, UA: NEGATIVE
Protein: NEGATIVE
RBC, UA: NONE SEEN /HPF (ref 0–2)
RBC: NONE SEEN /HPF (ref 0–2)
SQUAMOUS EPITHELIAL CELLS: NONE SEEN /HPF (ref <=5)
Specific Gravity, UA: 1.007 NA (ref 1.001–1.035)
Specific gravity: 1.007 (ref 1.001–1.035)
Squamous Epithelial Cells: NONE SEEN /HPF (ref <=5)
pH (UA): 6 (ref 5.0–8.0)
pH, UA: 6 NA (ref 5.0–8.0)

## 2019-07-04 LAB — CULTURE, URINE
MICRO NUMBER: 10481799
Micro Number: 10481799 NA
SPECIMEN QUALITY: ADEQUATE
Specimen Quality: ADEQUATE

## 2020-03-06 ENCOUNTER — Ambulatory Visit
Admit: 2020-03-06 | Discharge: 2020-03-06 | Payer: PRIVATE HEALTH INSURANCE | Attending: Family | Primary: Internal Medicine

## 2020-03-06 ENCOUNTER — Ambulatory Visit: Attending: Family | Primary: Internal Medicine

## 2020-03-06 DIAGNOSIS — J069 Acute upper respiratory infection, unspecified: Secondary | ICD-10-CM

## 2020-03-06 MED ORDER — CODEINE-GUAIFENESIN 10 MG-100 MG/5 ML ORAL LIQUID
100-10 mg/5 mL | Freq: Three times a day (TID) | ORAL | 0 refills | Status: AC | PRN
Start: 2020-03-06 — End: 2020-03-13

## 2020-03-06 MED ORDER — FLUTICASONE 50 MCG/ACTUATION NASAL SPRAY, SUSP
50 mcg/actuation | Freq: Every day | NASAL | 1 refills | Status: DC
Start: 2020-03-06 — End: 2021-04-25

## 2020-03-06 NOTE — Progress Notes (Signed)
I reviewed the patient's medical history, the nurse practitioner's findings on physical examination, the patient's diagnoses, and treatment plan as documented in the progress note. I concur with the treatment plan as documented. There are no additional recommendations at this time.    Aashrith Eves, MD

## 2020-03-06 NOTE — Progress Notes (Signed)
Assessment/Plan:   1. Viral URI  -     guaiFENesin-codeine (ROBITUSSIN AC) 100-10 mg/5 mL solution; Take 5 mL by mouth three (3) times daily as needed for Cough for up to 7 days. Max Daily Amount: 15 mL., Normal, Disp-100 mL, R-0Reference #: [id number]  -     fluticasone propionate (Flonase) 50 mcg/actuation nasal spray; 2 Sprays by Both Nostrils route daily., Normal, Disp-16 g, R-1  -     COVID-19, NP; Future  2. Current every day smoker  The patient was counseled on the dangers of tobacco use, and was reluctant to quit    Encouraged PO hydration  Mucinex  Antitussive  Humidifier  Gargle/Lozenges  Otc analgesic prn pain/fever  Monitor temp daily or prn  Flonase/saline nasal spray    The above diagnosis is a new problem.  We discussed expected course, resolution, and complications of diagnosis in detail.      Return if symptoms worsen or fail to improve.    Subjective:   Zayla presents for evaluation of Nasal Congestion and Cough     This started 4 days ago, and is unchanged since that time.  She also reports URI symptoms, fatigue, sinus congestion, rhinorrhea, post nasal drip, sore throat, swollen glands and dry cough.  She denies a history of: fever, myalgias, headache, bilateral ear pain, bilateral sinus pain, hoarseness, SOB and DOE.  Treatments have included: alkaseltzer cold and flu.  Relevant PMH: smoker.  Patient reports sick contacts: yes - husband had uri symptoms last week    BP 130/84 (BP 1 Location: Left upper arm, BP Patient Position: Sitting, BP Cuff Size: Adult)    Pulse 79    Temp 98 ??F (36.7 ??C) (Temporal)    Resp 14    Ht 5\' 3"  (1.6 m)    Wt 163 lb (73.9 kg)    SpO2 98%    BMI 28.87 kg/m??      Physical Exam  Constitutional:       General: She is not in acute distress.     Appearance: Normal appearance. She is ill-appearing. She is not toxic-appearing.   HENT:      Right Ear: Tympanic membrane and ear canal normal.      Left Ear: Tympanic membrane and ear canal normal.      Mouth/Throat:       Mouth: Mucous membranes are moist.      Pharynx: No posterior oropharyngeal erythema.      Comments: +PND  Cardiovascular:      Rate and Rhythm: Normal rate and regular rhythm.   Pulmonary:      Effort: Pulmonary effort is normal. No respiratory distress.      Breath sounds: Normal breath sounds. No wheezing or rales.   Musculoskeletal:      Cervical back: Normal range of motion.   Lymphadenopathy:      Cervical: Cervical adenopathy present.   Skin:     General: Skin is warm and dry.   Neurological:      Mental Status: She is alert and oriented to person, place, and time.   Psychiatric:         Mood and Affect: Mood normal.         Behavior: Behavior normal.         Thought Content: Thought content normal.         Judgment: Judgment normal.         An electronic signature was used to authenticate this note.  --  Dennison Mascot, FNP

## 2020-03-07 ENCOUNTER — Telehealth
Admit: 2020-03-07 | Discharge: 2020-03-07 | Payer: PRIVATE HEALTH INSURANCE | Attending: Internal Medicine | Primary: Internal Medicine

## 2020-03-07 ENCOUNTER — Inpatient Hospital Stay: Admit: 2020-03-07 | Payer: PRIVATE HEALTH INSURANCE | Attending: Family | Primary: Internal Medicine

## 2020-03-07 ENCOUNTER — Telehealth: Attending: Internal Medicine | Primary: Internal Medicine

## 2020-03-07 DIAGNOSIS — J069 Acute upper respiratory infection, unspecified: Secondary | ICD-10-CM

## 2020-03-07 DIAGNOSIS — J4520 Mild intermittent asthma, uncomplicated: Secondary | ICD-10-CM

## 2020-03-07 MED ORDER — AZITHROMYCIN 250 MG TAB
250 mg | ORAL_TABLET | ORAL | 0 refills | Status: DC
Start: 2020-03-07 — End: 2020-03-29

## 2020-03-07 MED ORDER — FLUTICASONE-SALMETEROL 100 MCG-50 MCG/DOSE DISK DEVICE FOR INHALATION
100-50 mcg/dose | Freq: Two times a day (BID) | RESPIRATORY_TRACT | 0 refills | Status: DC
Start: 2020-03-07 — End: 2020-03-29

## 2020-03-07 NOTE — Progress Notes (Signed)
advised

## 2020-03-07 NOTE — Progress Notes (Signed)
Lindsay Liu (DOB: Sep 09, 1965) is a 54 y.o. female, established patient, here for evaluation of the following chief complaint(s):   Cough (Worse since yesterday.  Symptoms started last Friday)       ASSESSMENT/PLAN:  1. Mild intermittent asthmatic bronchitis without complication  -     fluticasone propion-salmeteroL (ADVAIR/WIXELA) 100-50 mcg/dose diskus inhaler; Take 1 Puff by inhalation every twelve (12) hours., Normal, Disp-60 Each, R-0  -     azithromycin (ZITHROMAX) 250 mg tablet; Take two tablets today then one tablet daily, Normal, Disp-6 Tablet, R-0  2. Current every day smoker  Smoking cessation strongly advised    No follow-ups on file.    SUBJECTIVE/OBJECTIVE:  Symptoms began last Friday.  Son also had an upper respiratory illness.  Saw nurse practitioner in office yesterday and given Flonase and Robitussin-AC.  Feels as if cough is gotten worse and symptoms have moved down to chest.  In the past this has signaled bronchitis.  Patient continues to smoke however is smoking less because her chest is hurting her.  She denies fever.    Has been fully vaccinated as well as had a booster to Covid.  Did go for Covid testing today.  Results pending    Cough  The history is provided by the patient. This is a new problem. The problem occurs constantly. Pertinent negatives include no shortness of breath.       Review of Systems   Constitutional: Positive for fatigue. Negative for chills and fever.   Respiratory: Positive for cough. Negative for shortness of breath.         Patient-Reported Vitals 03/07/2020   Patient-Reported Weight 165   Patient-Reported Height 5???4???   Patient-Reported Temperature 98.6       Physical Exam    [INSTRUCTIONS:  "[x] " Indicates a positive item  "[] " Indicates a negative item  -- DELETE ALL ITEMS NOT EXAMINED]    Constitutional: [x]  Appears well-developed and well-nourished [x]  No apparent distress      []  Abnormal -     Mental status: [x]  Alert and awake  [x]  Oriented to  person/place/time [x]  Able to follow commands    []  Abnormal -     Eyes:   EOM    [x]   Normal    []  Abnormal -   Sclera  [x]   Normal    []  Abnormal -          Discharge [x]   None visible   []  Abnormal -     HENT: [x]  Normocephalic, atraumatic  []  Abnormal -   [x]  Mouth/Throat: Mucous membranes are moist    External Ears [x]  Normal  []  Abnormal -    Neck: [x]  No visualized mass []  Abnormal -     Pulmonary/Chest: [x]  Respiratory effort normal   [x]  No visualized signs of difficulty breathing or respiratory distress        []  Abnormal -      Musculoskeletal:   [x]  Normal gait with no signs of ataxia         [x]  Normal range of motion of neck        []  Abnormal -     Neurological:        [x]  No Facial Asymmetry (Cranial nerve 7 motor function) (limited exam due to video visit)          [x]  No gaze palsy        []  Abnormal -          Skin:        [  x] No significant exanthematous lesions or discoloration noted on facial skin         []  Abnormal -            Psychiatric:       [x]  Normal Affect []  Abnormal -        [x]  No Hallucinations    Other pertinent observable physical exam findings:-              Martesha Freid is being evaluated by a Virtual Visit (video visit) encounter to address concerns as mentioned above.  A caregiver was present when appropriate. Due to this being a (During COVID-19 public health emergency), evaluation of the following organ systems was limited: Vitals/Constitutional/EENT/Resp/CV/GI/GU/MS/Neuro/Skin/Heme-Lymph-Imm.  Pursuant to the emergency declaration under the [facility] Act and the , 1135 waiver authority and the and Scientist, research (medical) Act, this Virtual Visit was conducted with patient's (and/or legal guardian's) consent, to reduce the patient's risk of exposure to COVID-19 and provide necessary medical care.  The patient (and/or legal guardian) has also been advised to contact this office for worsening  conditions or problems, and seek emergency medical treatment and/or call 911 if deemed necessary.         Services were provided through a video synchronous discussion virtually to substitute for in-person clinic visit. Patient was located at home and provider was located in office or at home.     An electronic signature was used to authenticate this note.  -- [facility], MD

## 2020-03-07 NOTE — Progress Notes (Signed)
Neg for covid

## 2020-03-08 LAB — COVID-19, NP
SARS-CoV-2: NOT DETECTED
SARS-CoV-2: NOT DETECTED

## 2020-03-09 ENCOUNTER — Encounter

## 2020-03-14 NOTE — Telephone Encounter (Signed)
Spoke with patient.  Feeling mostly better however lots of head congestion and ear popping.  Recommend Mucinex, nasal saline, and nasal steroids.  Return to office if no improvement

## 2020-03-14 NOTE — Telephone Encounter (Signed)
Patient now feels she has a sinus infection.  Should she be seen again?  Seen 11/23 and 11/24    Tel:  [phone]

## 2020-03-19 NOTE — Progress Notes (Signed)
Spoke with pt

## 2020-03-19 NOTE — Progress Notes (Signed)
Labs are good except for significantly elevated cholesterol and mild vitamin D deficiency.  Please take vitamin D 1000 IUs daily.  We will discuss at upcoming visit starting you on a statin medication

## 2020-03-20 LAB — CBC WITH AUTOMATED DIFF
ABS. BASOPHILS: 46 cells/uL (ref 0–200)
ABS. EOSINOPHILS: 139 cells/uL (ref 15–500)
ABS. LYMPHOCYTES: 2587 cells/uL (ref 850–3900)
ABS. MONOCYTES: 385 cells/uL (ref 200–950)
ABS. NEUTROPHILS: 4543 cells/uL (ref 1500–7800)
BASOPHILS: 0.6 % (ref 0–2)
EOSINOPHILS: 1.8 % (ref 0–8)
HCT: 44.6 % (ref 35.0–45.0)
HGB: 14.6 g/dL (ref 11.7–15.5)
LYMPHOCYTES: 33.6 % (ref 15–49)
MCH: 30.7 pg (ref 27.0–33.0)
MCHC: 32.7 g/dL (ref 32.0–36.0)
MCV: 93.9 fL (ref 80.0–100.0)
MEAN PLATELET VOLUME: 10.4 fL (ref 7.5–12.5)
MONOCYTES: 5 % (ref 0–13)
Neutrophils: 59 % (ref 38–80)
PLATELET: 295 10*3/uL (ref 140–400)
RBC: 4.75 10*6/uL (ref 3.80–5.10)
RDW: 12.3 % (ref 11.0–15.0)
WBC: 7.7 10*3/uL (ref 3.8–10.8)

## 2020-03-20 LAB — URINALYSIS W/ RFLX MICROSCOPIC
Bilirubin, Urine: NEGATIVE
Bilirubin: NEGATIVE
Blood, Urine: NEGATIVE
Blood: NEGATIVE
Glucose, Ur: NEGATIVE
Glucose: NEGATIVE
Leukocyte Esterase, Urine: NEGATIVE
Leukocyte Esterase: NEGATIVE
Nitrite, Urine: NEGATIVE
Nitrites: NEGATIVE
Protein, UA: NEGATIVE
Protein: NEGATIVE
Specific Gravity, UA: 1.024 NA (ref 1.001–1.035)
Specific gravity: 1.024 (ref 1.001–1.035)
pH (UA): <=5 (ref 5.0–8.0)
pH, UA: <=5 (ref 5.0–8.0)

## 2020-03-20 LAB — METABOLIC PANEL, COMPREHENSIVE
ALB/GLOBRATIO: 1.8 (calc) (ref 1.0–2.5)
ALT (SGPT): 28 U/L (ref 6–29)
AST (SGOT): 20 U/L (ref 10–35)
Albumin: 4.4 g/dL (ref 3.6–5.1)
Alkaline Phosphatase, total: 77 U/L (ref 37–153)
BUN: 19 mg/dL (ref 7–25)
Bilirubin, total: 0.6 mg/dL (ref 0.2–1.2)
CO2: 22 mmol/L (ref 20–32)
Calcium: 9.6 mg/dL (ref 8.6–10.4)
Chloride: 106 mmol/L (ref 98–110)
Creatinine: 0.88 mg/dL (ref 0.50–1.05)
GFR est AA: 85 mL/min/{1.73_m2} (ref >=60)
GFR est non-AA: 73 mL/min/{1.73_m2} (ref >=60)
Globulin: 2.4 g/dL (calc) (ref 1.9–3.7)
Glucose: 90 mg/dL (ref 65–99)
Potassium: 4.4 mmol/L (ref 3.5–5.3)
Protein, total: 6.8 g/dL (ref 6.1–8.1)
Sodium: 141 mmol/L (ref 135–146)

## 2020-03-20 LAB — LIPID PANEL
Chol/HDL Ratio: 4.4 (calc) (ref <5.0)
Cholesterol, Total: 265 mg/dL — ABNORMAL HIGH (ref <200)
Cholesterol, total: 265 mg/dL — ABNORMAL HIGH (ref <200)
Cholesterol/HDL ratio: 4.4 (calc) (ref <5.0)
HDL Cholesterol: 60 mg/dL (ref >=50)
HDL: 60 mg/dL (ref >=50)
LDL Cholesterol: 184 mg/dL (calc) — ABNORMAL HIGH
LDL-CHOLESTEROL: 184 mg/dL (calc) — ABNORMAL HIGH
Non-HDL Cholesterol: 205 mg/dL (calc) — ABNORMAL HIGH (ref <130)
Non-HDL Cholesterol: 205 mg/dL (calc) — ABNORMAL HIGH (ref <130)
Triglyceride: 96 mg/dL (ref <150)
Triglycerides: 96 mg/dL (ref <150)

## 2020-03-20 LAB — VITAMIN B12
Vitamin B-12: 459 pg/mL (ref 200–1100)
Vitamin B12: 459 pg/mL (ref 200–1100)

## 2020-03-20 LAB — FERRITIN
Ferritin: 81 ng/mL (ref 16–232)
Ferritin: 81 ng/mL (ref 16–232)

## 2020-03-20 LAB — VITAMIN D, 25 HYDROXY: VITAMIN D, 25-HYDROXY: 29 ng/mL — ABNORMAL LOW (ref 30–100)

## 2020-03-20 LAB — TSH REFLEX TO T4F
TSH W/REFLEX TO FT4: 1.1 mIU/L (ref 0.40–4.50)
TSH W/REFLEX TO FT4: 1.1 mIU/L (ref 0.40–4.50)

## 2020-03-20 LAB — VITAMIN D 25 HYDROXY: Vit D, 25-Hydroxy: 29 ng/mL — ABNORMAL LOW (ref 30–100)

## 2020-03-20 LAB — CBC WITH AUTO DIFFERENTIAL
Basophils %: 0.6 % (ref 0–2)
Basophils Absolute: 46 cells/uL (ref 0–200)
Eosinophils %: 1.8 % (ref 0–8)
Eosinophils Absolute: 139 cells/uL (ref 15–500)
Hematocrit: 44.6 % (ref 35.0–45.0)
Hemoglobin: 14.6 g/dL (ref 11.7–15.5)
Lymphocytes %: 33.6 % (ref 15–49)
Lymphocytes Absolute: 2587 cells/uL (ref 850–3900)
MCH: 30.7 pg (ref 27.0–33.0)
MCHC: 32.7 g/dL (ref 32.0–36.0)
MCV: 93.9 fL (ref 80.0–100.0)
MPV: 10.4 fL (ref 7.5–12.5)
Monocytes %: 5 % (ref 0–13)
Monocytes Absolute: 385 cells/uL (ref 200–950)
Neutrophils %: 59 % (ref 38–80)
Neutrophils Absolute: 4543 cells/uL (ref 1500–7800)
Platelets: 295 10*3/uL (ref 140–400)
RBC: 4.75 10*6/uL (ref 3.80–5.10)
RDW: 12.3 % (ref 11.0–15.0)
WBC: 7.7 10*3/uL (ref 3.8–10.8)

## 2020-03-20 LAB — COMPREHENSIVE METABOLIC PANEL
ALT: 28 U/L (ref 6–29)
AST: 20 U/L (ref 10–35)
Albumin/Globulin Ratio: 1.8 (calc) (ref 1.0–2.5)
Albumin: 4.4 g/dL (ref 3.6–5.1)
Alkaline Phosphatase: 77 U/L (ref 37–153)
BUN: 19 mg/dL (ref 7–25)
CO2: 22 mmol/L (ref 20–32)
Calcium: 9.6 mg/dL (ref 8.6–10.4)
Chloride: 106 mmol/L (ref 98–110)
Creatinine: 0.88 mg/dL (ref 0.50–1.05)
EGFR IF NonAfrican American: 73 mL/min/{1.73_m2} (ref >=60)
GFR African American: 85 mL/min/{1.73_m2} (ref >=60)
Globulin: 2.4 g/dL (calc) (ref 1.9–3.7)
Glucose: 90 mg/dL (ref 65–99)
Potassium: 4.4 mmol/L (ref 3.5–5.3)
Sodium: 141 mmol/L (ref 135–146)
Total Bilirubin: 0.6 mg/dL (ref 0.2–1.2)
Total Protein: 6.8 g/dL (ref 6.1–8.1)

## 2020-03-28 LAB — HM PAP SMEAR

## 2020-03-29 ENCOUNTER — Ambulatory Visit
Admit: 2020-03-29 | Discharge: 2020-03-29 | Payer: PRIVATE HEALTH INSURANCE | Attending: Internal Medicine | Primary: Internal Medicine

## 2020-03-29 ENCOUNTER — Ambulatory Visit: Attending: Internal Medicine | Primary: Internal Medicine

## 2020-03-29 DIAGNOSIS — Z0000 Encounter for general adult medical examination without abnormal findings: Secondary | ICD-10-CM

## 2020-03-29 MED ORDER — ROSUVASTATIN 10 MG TAB
10 mg | ORAL_TABLET | Freq: Every evening | ORAL | 3 refills | Status: DC
Start: 2020-03-29 — End: 2020-04-23

## 2020-03-29 MED ORDER — DICLOFENAC 50 MG TAB
50 mg | ORAL_TABLET | Freq: Three times a day (TID) | ORAL | 1 refills | Status: DC
Start: 2020-03-29 — End: 2021-06-03

## 2020-03-29 NOTE — Progress Notes (Signed)
Chief Complaint:   Complete Physical      History of present illness:    Lindsay Liu is a 54 y.o. female with a past medical history as below who comes for annual complete physical exam.   She still smokes 5 cigarettes daily.  We had an extensive discussion regarding the need to discontinue this practice.  Patient states right now she is very stressed secondary to her mother being ill with multiple myeloma and feels that she cannot cut down her cigarettes anymore.    We reviewed her blood work which reveals a significant elevation in her cholesterol profile.  She had been on a statin in the past and is now agreeable to go on it again.    She will be calling her gastroenterologist to go for repeat colonoscopy.  She was given a prescription for mammography today.  She has had 2 Covid shots as well as a Covid booster.  She was given a flu shot today.  We also discussed shingles vaccine and she will consider it for the future.    She sees her gynecologist and is up-to-date on her Pap smear                3 most recent PHQ Screens 03/29/2020   Little interest or pleasure in doing things Not at all   Feeling down, depressed, irritable, or hopeless Not at all   Total Score PHQ 2 0   Trouble falling or staying asleep, or sleeping too much Not at all   Feeling tired or having little energy Not at all   Poor appetite, weight loss, or overeating More than half the days   Feeling bad about yourself - or that you are a failure or have let yourself or your family down Not at all   Trouble concentrating on things such as school, work, reading, or watching TV -   Moving or speaking so slowly that other people could have noticed; or the opposite being so fidgety that others notice Not at all   Thoughts of being better off dead, or hurting yourself in some way Not at all   PHQ 9 Score -   How difficult have these problems made it for you to do your work, take care of your home and get along with others Not difficult at all        CAGE - [place] 03/29/2020   Have you ever felt the need to cut down on your drinking or drug use? No   Have people annoyed you by criticizing your drinking or drug use? 0   Have you ever felt guilty about drinking or drug use? 0   Have you ever had a drink or used drugs first thing in the morning to steady your nerves or to get rid of a hangover (eye-opener)?  0   CAGE AID Score - >= 2 is considered clinically significant 0       CAGE-AID screening Negative, reviewed.    Review of Systems: Positive for-occasional back pain back pain  Review of Systems is negative for chest pain, chest pressure, nausea, vomiting, diarrhea, headache, fever, chills, hemoptysis, epistaxis, hematemesis, bright red blood per rectum, hematuria, dysuria, dyspnea, orthopnea, melena, depression, anxiety, or any recent unexplained weight loss or weight gain except as mentioned in HPI.      Prior to Admission medications    Medication Sig Start Date End Date Taking? Authorizing Provider   diclofenac potassium (CATAFLAM) 50 mg tablet Take 1 Tablet  by mouth three (3) times daily. 03/29/20  Yes Domingo Mend, MD   cholecalciferol (VITAMIN D3) 25 mcg (1,000 unit) cap Take  by mouth daily.   Yes Provider, Historical   omega-3s/dha/epa/fish oil (OMEGA 3 PO) Take  by mouth.   Yes Provider, Historical   fluticasone propionate (Flonase) 50 mcg/actuation nasal spray 2 Sprays by Both Nostrils route daily. 03/06/20  Yes Mar[place], Rosemarie Ax, FNP   Restasis 0.05 % dpet Administer 1 Drop to both eyes two (2) times a day. 06/10/19  Yes Provider, Historical   Lactobacillus acidophilus (PROBIOTIC PO) Take  by mouth daily.   Yes Provider, Historical   cetirizine HCl (ZYRTEC PO) Take  by mouth as needed.   Yes Provider, Historical            Allergies   Allergen Reactions   ??? Levaquin [Levofloxacin] Shortness of Breath     Cipro in the past without adverse effect   ??? Penicillin G Shortness of Breath           HISTORY:  Past Medical History:   Diagnosis Date   ???  Cervical spine pain     Oh   ??? Chronic urticaria    ??? Endometriosis    ??? Hypercholesterolemia    ??? Stye 10/2016    Dr Otho Perl -> [facility]   ??? Thyroid disease        Past Surgical History:   Procedure Laterality Date   ??? HX APPENDECTOMY     ??? HX CHOLECYSTECTOMY  01/1994   ??? HX LAPAROTOMY         Family History   Problem Relation Age of Onset   ??? Elevated Lipids Mother    ??? Heart Disease Mother    ??? Cancer Mother 33        multiple myeloma   ??? Heart Disease Father    ??? No Known Problems Daughter    ??? No Known Problems Son    ??? No Known Problems Sister    ??? Elevated Lipids Brother    ??? Elevated Lipids Brother        Social History     Tobacco Use   ??? Smoking status: Current Every Day Smoker     Packs/day: 0.25     Years: 30.00     Pack years: 7.50     Types: Cigarettes   ??? Smokeless tobacco: Never Used   Vaping Use   ??? Vaping Use: Never used   Substance Use Topics   ??? Alcohol use: Yes     Alcohol/week: 1.0 standard drink     Types: 1 Standard drinks or equivalent per week     Comment: social   ??? Drug use: No       Immunization History   Administered Date(s) Administered   ??? COVID-19, Moderna, Primary or Immunocompromised Series, MRNA, PF, 175mg/0.5mL 07/12/2019, 08/09/2019, 02/20/2020   ??? Influenza Vaccine (>6 mo Afluria QUAD Vial 940086(0.25 mL) / [id number](0.5 mL)) 01/05/2015, 01/14/2017   ??? Influenza Vaccine (Quadrivalent)(>18 Yrs Flublok [id number] 01/28/2018, 03/02/2019, 03/29/2020   ??? Influenza Vaccine PF 02/24/2013   ??? Pneumococcal Polysaccharide (PPSV-23) 01/14/2017   ??? Tdap 01/05/2015       Patient Care Team:  FDomingo Mend MD as PCP - General (Internal Medicine)  FDomingo Mend MD as PCP - [facility]  Oh, CPercival Spanish MD (Orthopedic Surgery)  GEverardo Pacific NP  MLujean Amel MD (Gastroenterology)  LComer Locket MD (Ophthalmology)  Hostin,  Bonnita Nasuti, MD (Obstetrics & Gynecology)  Darlin Coco, MD (Endocrinology)      Objective:   Vitals:          Vitals:    03/29/20 1025   BP: 110/80   Pulse:  100   Temp: 98 ??F (36.7 ??C)   TempSrc: Temporal   SpO2: 99%   Weight: 161 lb (73 kg)   Height: 5' 3"  (1.6 m)       PHYSICAL EXAM:    General:    Alert, cooperative, no distress, appears stated age.     HEENT: Atraumatic, anicteric sclerae, pink conjunctivae     No oral ulcers, mucosa moist, throat clear  Neck:  Supple, symmetrical,  thyroid: non tender  Back:    No CVA tenderness.  Lungs:   Clear to auscultation bilaterally.  No Wheezing or Rhonchi. No rales.  Chest wall:  No tenderness  No Accessory muscle use.  Heart:   Regular  rhythm,  No  murmur   No gallop.  No edema  Abdomen:   Soft, non-tender. Not distended.  Bowel sounds normal. No masses  Extremities: No cyanosis.  No clubbing No edema  Skin:     Not pale Not Jaundiced  No rashes   Lymph nodes: Cervical, supraclavicular normal.  Psych:  Good insight.  Not depressed.  Not anxious or agitated.  Neurologic: EOMs intact. No facial asymmetry. No aphasia or slurred speech. Normal   strength, Alert and oriented X 3.   Breasts: breasts appear normal, no suspicious masses, no skin or nipple changes or axillary nodes.  Lab Data Reviewed:   Orders Only on 03/19/2020   Component Date Value Ref Range Status   ??? Cholesterol, total 03/19/2020 265* <200 mg/dL Final   ??? HDL Cholesterol 03/19/2020 60  > OR = 50 mg/dL Final   ??? Triglyceride 03/19/2020 96  <150 mg/dL Final   ??? LDL-CHOLESTEROL 03/19/2020 184* mg/dL (calc) Final    Comment: Reference range: <100     Desirable range <100 mg/dL for primary prevention;    <70 mg/dL for patients with CHD or diabetic patients   with > or = 2 CHD risk factors.     LDL-C is now calculated using the Martin-Hopkins   calculation, which is a validated novel method providing   better accuracy than the Friedewald equation in the   estimation of LDL-C.   Cresenciano Genre et al. Annamaria Helling. 4854;627(03): 2061-2068   (http://education.[facility].com/faq/FAQ164)     ??? Cholesterol/HDL ratio 03/19/2020 4.4  <5.0 (calc) Final   ??? Non-HDL Cholesterol  03/19/2020 205* <130 mg/dL (calc) Final    Comment: For patients with diabetes plus 1 major ASCVD risk   factor, treating to a non-HDL-C goal of <100 mg/dL   (LDL-C of <70 mg/dL) is considered a therapeutic   option.     ??? Glucose 03/19/2020 90  65 - 99 mg/dL Final    Comment:               Fasting reference interval        ??? BUN 03/19/2020 19  7 - 25 mg/dL Final   ??? Creatinine 03/19/2020 0.88  0.50 - 1.05 mg/dL Final    Comment: For patients >61 years of age, the reference limit  for Creatinine is approximately 13% higher for people  identified as African-American.        ??? GFR est non-AA 03/19/2020 73  > OR = 60 mL/min/1.80m Final   ??? GFR est AA 03/19/2020 85  >  OR = 60 mL/min/1.4m Final   ??? BUN/Creatinine ratio 104/54/0981NOT APPLICABLE  6 - 22 (calc) Final   ??? Sodium 03/19/2020 141  135 - 146 mmol/L Final   ??? Potassium 03/19/2020 4.4  3.5 - 5.3 mmol/L Final   ??? Chloride 03/19/2020 106  98 - 110 mmol/L Final   ??? CO2 03/19/2020 22  20 - 32 mmol/L Final   ??? Calcium 03/19/2020 9.6  8.6 - 10.4 mg/dL Final   ??? Protein, total 03/19/2020 6.8  6.1 - 8.1 g/dL Final   ??? Albumin 03/19/2020 4.4  3.6 - 5.1 g/dL Final   ??? Globulin 03/19/2020 2.4  1.9 - 3.7 g/dL (calc) Final   ??? ALB/GLOBRATIO 03/19/2020 1.8  1.0 - 2.5 (calc) Final   ??? Bilirubin, total 03/19/2020 0.6  0.2 - 1.2 mg/dL Final   ??? Alkaline Phosphatase, total 03/19/2020 77  37 - 153 U/L Final   ??? AST (SGOT) 03/19/2020 20  10 - 35 U/L Final   ??? ALT (SGPT) 03/19/2020 28  6 - 29 U/L Final   ??? VITAMIN D, 25-HYDROXY 03/19/2020 29* 30 - 100 ng/mL Final    Comment: Vitamin D Status         25-OH Vitamin D:     Deficiency:                    <20 ng/mL  Insufficiency:             20 - 29 ng/mL  Optimal:                 > or = 30 ng/mL     For 25-OH Vitamin D testing on patients on   D2-supplementation and patients for whom quantitation   of D2 and D3 fractions is required, the [facility](TM)  25-OH VIT D, (D2,D3), LC/MS/MS is recommended: order   code [id number](patients  >255yr.  See Note 1     Note 1     For additional information, please refer to   http://education.[facility].com/faq/FAQ199   (This link is being provided for informational/  educational purposes only.)     ??? TSH W/REFLEX TO FT4 03/19/2020 1.10  0.40 - 4.50 mIU/L Final   ??? WBC 03/19/2020 7.7  3.8 - 10.8 Thousand/uL Final   ??? RBC 03/19/2020 4.75  3.80 - 5.10 Million/uL Final   ??? HGB 03/19/2020 14.6  11.7 - 15.5 g/dL Final   ??? HCT 03/19/2020 44.6  35.0 - 45.0 % Final   ??? MCV 03/19/2020 93.9  80.0 - 100.0 fL Final   ??? MCH 03/19/2020 30.7  27.0 - 33.0 pg Final   ??? MCHC 03/19/2020 32.7  32.0 - 36.0 g/dL Final   ??? RDW 03/19/2020 12.3  11.0 - 15.0 % Final   ??? PLATELET 03/19/2020 295  140 - 400 Thousand/uL Final   ??? MEAN PLATELET VOLUME 03/19/2020 10.4  7.5 - 12.5 fL Final   ??? ABS. NEUTROPHILS 03/19/2020 4,543  1,500 - 7,800 cells/uL Final   ??? ABS. LYMPHOCYTES 03/19/2020 2,587  850 - 3,900 cells/uL Final   ??? ABS. MONOCYTES 03/19/2020 385  200 - 950 cells/uL Final   ??? ABS. EOSINOPHILS 03/19/2020 139  15 - 500 cells/uL Final   ??? ABS. BASOPHILS 03/19/2020 46  0 - 200 cells/uL Final   ??? Neutrophils 03/19/2020 59  38 - 80 % Final   ??? LYMPHOCYTES 03/19/2020 33.6  15 - 49 % Final   ??? MONOCYTES 03/19/2020 5.0  0 -  13 % Final   ??? EOSINOPHILS 03/19/2020 1.8  0 - 8 % Final   ??? BASOPHILS 03/19/2020 0.6  0 - 2 % Final   ??? Color 03/19/2020 YELLOW  YELLOW Final   ??? Appearance 03/19/2020 CLEAR  CLEAR Final   ??? Specific gravity 03/19/2020 1.024  1.001 - 1.035 Final   ??? pH (UA) 03/19/2020 < OR = 5.0  5.0 - 8.0 Final   ??? Glucose 03/19/2020 NEGATIVE  NEGATIVE Final   ??? Bilirubin 03/19/2020 NEGATIVE  NEGATIVE Final   ??? Ketone 03/19/2020 TRACE* NEGATIVE Final   ??? Blood 03/19/2020 NEGATIVE  NEGATIVE Final   ??? Protein 03/19/2020 NEGATIVE  NEGATIVE Final   ??? Nitrites 03/19/2020 NEGATIVE  NEGATIVE Final   ??? Leukocyte Esterase 03/19/2020 NEGATIVE  NEGATIVE Final   ??? Ferritin 03/19/2020 81  16 - 232 ng/mL Final   ??? Vitamin B12 03/19/2020 459   200 - 1,100 pg/mL Final   Hospital Outpatient Visit on 03/07/2020   Component Date Value Ref Range Status   ??? SARS-CoV-2 03/07/2020 Not Detected  Not Detected Final    Comment: (NOTE)  Performing Pathologist: Lenn Sink, MD,PhD  This test was performed at:  [facility]  [place] [address]  This test has not been FDA cleared or approved. This test has been  authorized by FDA under an Emergency Use Authorization (EUA) for use  by authorized CLIA laboratories certified to perform high-complexity  testing. This test has been authorized only for the detection of  nucleic acid from SARS-CoV-2, not for any other viruses or pathogens.  This test is only authorized for the duration of the declaration that  circumstances exist justifying the authorization of emergency use of  in vitro diagnostic tests for detection and/or diagnosis of COVID-19  under Section 564(b)(1) of the Act, 21 U.S.C. 176HYW-7(P)(7), unless  the authorization is terminated or revoked. This qualitative  real-time RT-PCR test was developed and its performance  characteristics determined by [employer], in conjunction with  [employer]'s TaqPathTM C                           OVID-19 Combo Kit. This result has been  released by an automatic process.            EKG No results found for any visits on 03/29/20.      Health Maintenance   Topic Date Due   ??? Cervical cancer screen  Never done   ??? Shingrix Vaccine Age 85> (1 of 2) Never done   ??? Colorectal Cancer Screening Combo  03/20/2020   ??? Breast Cancer Screen Mammogram  03/03/2021   ??? Lipid Screen  03/19/2021   ??? DTaP/Tdap/Td series (2 - Td or Tdap) 01/04/2025   ??? Bone Densitometry (Dexa) Screening  02/22/2029   ??? Pneumococcal 0-64 years (2 of 2 - PPSV23) 02/22/2029   ??? Hepatitis C Screening  Completed   ??? Flu Vaccine  Completed   ??? COVID-19 Vaccine  Completed         Assessment/Plan:   1. Well woman exam (no gynecological exam)  Prescription given for  mammography.  To consider Shingrix.  Follow-up with GYN    2. Current every day smoker  The patient was counseled on the dangers of tobacco use, and was advised to quit and reluctant to quit.  Reviewed strategies to maximize success, including removing cigarettes and smoking materials from environment, stress management  and substitution of other forms of reinforcement.      3. Hyperlipidemia, unspecified hyperlipidemia type  We will start rosuvastatin.  Patient to call with any side effects  - rosuvastatin (Crestor) 10 mg tablet; Take 1 Tablet by mouth nightly.  Dispense: 90 Tablet; Refill: 3    4. Screening mammogram, encounter for     - MAM 3D TOMO W MAMMO BI SCREENING INCL CAD; Future    5. Needs flu shot     - INFLUENZA VIRUS VACCINE, QUADRIVALENT (RIV4), DERIVED FROM RECOMBINANT DNA,HEMAGGLUTININ(HA) PROTEIN ONLY, PF ABX FREE        The patient is advised to quit smoking, begin progressive daily aerobic exercise program, follow a low fat, low cholesterol diet, attempt to lose weight, reduce exposure to stress, improve dietary compliance, continue current medications, continue current healthy lifestyle patterns and return for routine annual checkups.  Discussed the patient's I have reviewed/discussed the above normal BMI with the patient.  I have recommended the following interventions: dietary management education, guidance, and counseling and encourage exercise .       Care Plan discussed with:   [x] Patient   [] Family    [] Care Manager    [] Nursing   [] Consultant/Specialist :   [x]   >50% of visit spent in counseling and coordination of care       Discussed     [] Advanced Directives,  [x]  smoking cessation   [x]  diet   [x] excercise   [x] weight loss recommended  [] seat belts  [x] sun screen usage  [] safe sex practices  [  ] texting while driving  [  ]cell phone use while driving[x ] medication usage, compliance, and side effects          Follow up in   Follow-up and Dispositions    ?? Return in about 1 year  (around 03/29/2021) for cpe.               Note: This report was transcribed using voice recognition software and is thus   prone to syntax and contextual spelling errors. Please do not hesitate to call   me if anything needs clarification or a dictated addendum         ___________________________________________________     Domingo Mend, MD     ___________________________________________________

## 2020-04-05 ENCOUNTER — Encounter

## 2020-04-05 NOTE — Progress Notes (Signed)
Please tell patient that mammography is normal.  Repeat mammography in one year

## 2020-04-05 NOTE — Telephone Encounter (Signed)
covid test generated. Son is positive for covid.  Patient has no symptoms

## 2020-04-05 NOTE — Progress Notes (Signed)
Spoke with pt and provided results.  

## 2020-04-05 NOTE — Progress Notes (Signed)
Spoke with pt

## 2020-04-09 ENCOUNTER — Ambulatory Visit: Payer: PRIVATE HEALTH INSURANCE | Primary: Internal Medicine

## 2020-04-10 ENCOUNTER — Inpatient Hospital Stay: Admit: 2020-04-10 | Payer: PRIVATE HEALTH INSURANCE | Attending: Internal Medicine | Primary: Internal Medicine

## 2020-04-10 DIAGNOSIS — Z20822 Contact with and (suspected) exposure to covid-19: Secondary | ICD-10-CM

## 2020-04-10 NOTE — Progress Notes (Signed)
Patient advised

## 2020-04-10 NOTE — Progress Notes (Signed)
 L/m for pt to call office back. FI

## 2020-04-10 NOTE — Progress Notes (Signed)
Covid test is negative

## 2020-04-11 LAB — COVID-19, NP
SARS-CoV-2: NOT DETECTED
SARS-CoV-2: NOT DETECTED

## 2020-04-13 ENCOUNTER — Ambulatory Visit: Payer: PRIVATE HEALTH INSURANCE | Primary: Internal Medicine

## 2020-04-22 ENCOUNTER — Encounter

## 2020-04-23 MED ORDER — ATORVASTATIN 10 MG TAB
10 mg | ORAL_TABLET | Freq: Every day | ORAL | 2 refills | Status: AC
Start: 2020-04-23 — End: 2021-04-29

## 2020-04-23 NOTE — Addendum Note (Signed)
Addendum  Note by Pricilla Handler, MD at 04/23/20 [phone]                Author: Pricilla Handler, MD  Service: --  Author Type: Physician       Filed: 04/23/20 0936  Encounter Date: 04/22/2020  Status: Signed          Editor: Pricilla Handler, MD (Physician)          Addended by: Filbert Schilder A on: 04/23/2020 09:36 AM    Modules accepted: Orders

## 2020-04-23 NOTE — Telephone Encounter (Signed)
Telephone Encounter by Pricilla Handler, MD at 04/23/20 [phone]                Author: Pricilla Handler, MD  Service: --  Author Type: Physician       Filed: 04/23/20 0934  Encounter Date: 04/22/2020  Status: Signed          Editor: Pricilla Handler, MD (Physician)          From: Pricilla Handler, MDTo: Bing Quarry: 04/23/2020  8:50 AM ESTSubject: Cholesterol medsDo you want me to try the Lipitor again?

## 2020-09-20 ENCOUNTER — Ambulatory Visit
Admit: 2020-09-20 | Discharge: 2020-09-20 | Payer: PRIVATE HEALTH INSURANCE | Attending: Nurse Practitioner | Primary: Internal Medicine

## 2020-09-20 ENCOUNTER — Ambulatory Visit: Attending: Nurse Practitioner | Primary: Internal Medicine

## 2020-09-20 DIAGNOSIS — L740 Miliaria rubra: Secondary | ICD-10-CM

## 2020-09-20 MED ORDER — NYSTATIN-TRIAMCINOLONE 100,000 UNIT/G-0.1 % TOPICAL CREAM
100000-0.1 unit/g-% | Freq: Two times a day (BID) | CUTANEOUS | 0 refills | Status: AC
Start: 2020-09-20 — End: 2021-03-19

## 2020-09-20 NOTE — Progress Notes (Signed)
Chief Complaint:   Rash (on her neck, itchy, as well as underneath the breast.)      Subjective:     Lindsay Liu is a 55 y.o. female with hx of eczema    Itchy rash under breasts x 2 days  Neck, chin, antecub fossa red prickly rash x 4 days  Has been sweating a lot in those areas    Patient notes exposure to nothing new. Patient denies exposure to metals, new soaps, new skin products, new hair products, new clothing, new medications, new foods or new pets.  Pt denies fever, chills, discharge, warmth, streaking, myalgias, pain, trauma.       Past Medical History Reviewed  Current Outpatient Medications   Medication Sig Dispense Refill   ??? nystatin-triamcinolone (MYCOLOG II) topical cream Apply  to affected area two (2) times a day. 30 g 0   ??? atorvastatin (LIPITOR) 10 mg tablet Take 1 Tablet by mouth daily. 90 Tablet 2   ??? diclofenac potassium (CATAFLAM) 50 mg tablet Take 1 Tablet by mouth three (3) times daily. 60 Tablet 1   ??? cholecalciferol (VITAMIN D3) 25 mcg (1,000 unit) cap Take  by mouth daily.     ??? omega-3s/dha/epa/fish oil (OMEGA 3 PO) Take  by mouth.     ??? fluticasone propionate (Flonase) 50 mcg/actuation nasal spray 2 Sprays by Both Nostrils route daily. 16 g 1   ??? Restasis 0.05 % dpet Administer 1 Drop to both eyes two (2) times a day.     ??? Lactobacillus acidophilus (PROBIOTIC PO) Take  by mouth daily.     ??? cetirizine HCl (ZYRTEC PO) Take  by mouth as needed.       Allergies   Allergen Reactions   ??? Levaquin [Levofloxacin] Shortness of Breath     Cipro in the past without adverse effect   ??? Penicillin G Shortness of Breath         Review of Systems  Pertinent items are noted in HPI.    Objective:   OBJECTIVE:    Visit Vitals  BP 108/78 (BP 1 Location: Left upper arm, BP Patient Position: Sitting, BP Cuff Size: Adult)   Pulse 84   Temp 97.8 ??F (36.6 ??C) (Temporal)   Ht 5\' 3"  (1.6 m)   Wt 160 lb 12.8 oz (72.9 kg)   SpO2 98%   BMI 28.48 kg/m??       She appears well, vital signs are as noted. Alert and  Oriented x 3, Skin: anterior neck, and nape of neck, and antecub fossa erythematous prickly dermatitis c/w with heat rash.  Under bilateral breasts c/w yeast dermatitis, mildly erythematous no broken areas.   No warmth, dc, streaking, edema, fluctuance            Assessment & Plan:     Diagnoses and all orders for this visit:    1. Heat rash neck, antecubital fossas and nape of neck    2. Yeast dermatitis under bilateral breasts   -     nystatin-triamcinolone (MYCOLOG II) topical cream; Apply  to affected area two (2) times a day.        Zyrtec  pepcid AC  Benadryl   vaseline to moist skin  Nystatin under breasts  Triamcinolone to inguinal creases prn  Ice cubes     Care Plan discussed with:    [x] Patient   [] Family    [] Care Manager    [] Nursing   [] Consultant/Specialist :  re   (Discussed [] CODE  status,  [] Care Plan, [] D/C Planning)      [x]   >50% of visit spent in counseling and coordination of care    Total time spent with patient: 25    minutes      , NP  09/20/2020      Patient Care Team:  , MD as PCP - General (Internal Medicine Physician)  Leonard Schwartz, MD as PCP - [facility] Empaneled Provider  Oh, Pricilla Handler, MD (Orthopedic Surgery)  Pricilla Handler, NP  [facility], MD (Gastroenterology)  Adelene Amas, MD (Ophthalmology)  Lita Mains, MD (Obstetrics & Gynecology)  Markus Daft, MD (Endocrinology Physician)

## 2020-09-20 NOTE — Progress Notes (Signed)
I reviewed the patient's medical history, the nurse practitioner's findings on physical examination, the patient's diagnoses, and treatment plan as documented in the progress note. I concur with the treatment plan as documented. There are no additional recommendations at this time.    Helma Argyle, MD

## 2021-02-28 ENCOUNTER — Encounter

## 2021-03-19 ENCOUNTER — Ambulatory Visit
Admit: 2021-03-19 | Discharge: 2021-03-19 | Payer: PRIVATE HEALTH INSURANCE | Attending: Internal Medicine | Primary: Internal Medicine

## 2021-03-19 DIAGNOSIS — L2082 Flexural eczema: Secondary | ICD-10-CM

## 2021-03-19 MED ORDER — TACROLIMUS 0.1 % OINTMENT
0.1 % | Freq: Two times a day (BID) | CUTANEOUS | 1 refills | Status: AC
Start: 2021-03-19 — End: ?

## 2021-03-19 NOTE — Progress Notes (Signed)
Progress Notes by Pricilla Handler, MD at 03/19/21 1600                Author: Pricilla Handler, MD  Service: --  Author Type: Physician       Filed: 03/19/21 1630  Encounter Date: 03/19/2021  Status: Signed          Editor: Pricilla Handler, MD (Physician)                   Lindsay Liu (DOB: [birth year]) is a 55 y.o. female, established patient, here for evaluation of the following chief complaint(s):   Rash                         SUBJECTIVE/OBJECTIVE:   HPI   Patient came to the office over the summer and was diagnosed with a heat rash.  Given Mycolog cream which did help.  Now comes with a rash on the flexural surfaces in her axilla and elbow.  Very itchy and mildly erythematous.  No satellite lesions.  Mild  swelling.  No signs of infection.  Has history of eczema and has taken Protopic in the past    Review of Systems    Constitutional:  Negative for activity change, diaphoresis and fever.    Skin:  Positive for color change and rash .       Physical Exam   Vitals reviewed.    Constitutional:        General: She is not in acute distress.      Appearance: Normal appearance. She is obese. She is not ill-appearing.    HENT:       Head: Normocephalic and atraumatic.     Pulmonary:       Effort: Pulmonary effort is normal.     Musculoskeletal:          General: Normal range of motion.    Skin:      General: Skin is warm and dry.       Findings: Erythema and rash  present. Rash is not crusting, macular, papular, purpuric, pustular or vesicular.                  Comments: Raised erythema with definitive borders antecubital fossa and under axilla but not an axillary fold    Neurological:       Mental Status: She is alert.          ASSESSMENT/PLAN:      1. Flexural eczema   -     tacrolimus (PROTOPIC) 0.1 % ointment; Apply  to affected area two (2) times a day., Normal, Disp-60 g, R-1      No follow-ups on file.   On this date 03/19/2021 I have spent 20 minutes reviewing previous notes, test results and face to  face with the patient discussing the diagnosis and importance of compliance with the treatment plan as well as documenting on the day of the visit.      An electronic signature was used to authenticate this note.   -- Pricilla Handler, MD

## 2021-03-26 NOTE — Progress Notes (Signed)
Spoke with pt

## 2021-03-26 NOTE — Progress Notes (Signed)
Labs with significant improvement in cholesterol profile.  Continue atorvastatin.  We will discuss further at upcoming visit in January.  Merry Christmas!

## 2021-03-27 LAB — CBC WITH AUTOMATED DIFF
ABS. BASOPHILS: 27 cells/uL (ref 0–200)
ABS. EOSINOPHILS: 88 cells/uL (ref 15–500)
ABS. LYMPHOCYTES: 2672 cells/uL (ref 850–3900)
ABS. MONOCYTES: 510 cells/uL (ref 200–950)
ABS. NEUTROPHILS: 3502 cells/uL (ref 1500–7800)
BASOPHILS: 0.4 % (ref 0–2)
EOSINOPHILS: 1.3 % (ref 0–8)
HCT: 42.1 % (ref 35.0–45.0)
HGB: 13.7 g/dL (ref 11.7–15.5)
LYMPHOCYTES: 39.3 % (ref 15–49)
MCH: 31.1 pg (ref 27.0–33.0)
MCHC: 32.5 g/dL (ref 32.0–36.0)
MCV: 95.5 fL (ref 80.0–100.0)
MEAN PLATELET VOLUME: 11.2 fL (ref 7.5–12.5)
MONOCYTES: 7.5 % (ref 0–13)
Neutrophils: 51.5 % (ref 38–80)
PLATELET: 237 10*3/uL (ref 140–400)
RBC: 4.41 10*6/uL (ref 3.80–5.10)
RDW: 12.8 % (ref 11.0–15.0)
WBC: 6.8 10*3/uL (ref 3.8–10.8)

## 2021-03-27 LAB — URINALYSIS W/ RFLX MICROSCOPIC
Bilirubin, Urine: NEGATIVE
Bilirubin: NEGATIVE
Glucose, Ur: NEGATIVE
Glucose: NEGATIVE
Hyaline Casts, UA: NONE SEEN /LPF
Hyaline cast: NONE SEEN /LPF
Ketone: NEGATIVE
Ketones, Urine: NEGATIVE
Nitrite, Urine: NEGATIVE
Nitrites: NEGATIVE
Protein, UA: NEGATIVE
Protein: NEGATIVE
Specific Gravity, UA: 1.023 NA (ref 1.001–1.035)
Specific gravity: 1.023 (ref 1.001–1.035)
pH (UA): 5.5 (ref 5.0–8.0)
pH, UA: 5.5 NA (ref 5.0–8.0)

## 2021-03-27 LAB — NOTE

## 2021-03-27 LAB — METABOLIC PANEL, COMPREHENSIVE
ALB/GLOBRATIO: 2.1 (calc) (ref 1.0–2.5)
ALT (SGPT): 16 U/L (ref 6–29)
AST (SGOT): 16 U/L (ref 10–35)
Albumin: 4.2 g/dL (ref 3.6–5.1)
Alkaline Phosphatase, total: 65 U/L (ref 37–153)
BUN: 16 mg/dL (ref 7–25)
Bilirubin, total: 0.6 mg/dL (ref 0.2–1.2)
CO2: 23 mmol/L (ref 20–32)
Calcium: 9.1 mg/dL (ref 8.6–10.4)
Chloride: 105 mmol/L (ref 98–110)
Creatinine: 0.85 mg/dL (ref 0.50–1.03)
Globulin: 2 g/dL (calc) (ref 1.9–3.7)
Glucose: 93 mg/dL (ref 65–99)
Potassium: 4 mmol/L (ref 3.5–5.3)
Protein, total: 6.2 g/dL (ref 6.1–8.1)
Sodium: 138 mmol/L (ref 135–146)
eGFR: 80 mL/min/{1.73_m2} (ref >=60)

## 2021-03-27 LAB — LIPID PANEL
Chol/HDL Ratio: 3.7 (calc) (ref <5.0)
Cholesterol, Total: 174 mg/dL (ref <200)
Cholesterol, total: 174 mg/dL (ref <200)
Cholesterol/HDL ratio: 3.7 (calc) (ref <5.0)
HDL Cholesterol: 47 mg/dL — ABNORMAL LOW (ref >=50)
HDL: 47 mg/dL — ABNORMAL LOW (ref >=50)
LDL Cholesterol: 107 mg/dL (calc) — ABNORMAL HIGH
LDL-CHOLESTEROL: 107 mg/dL (calc) — ABNORMAL HIGH
Non-HDL Cholesterol: 127 mg/dL (calc) (ref <130)
Non-HDL Cholesterol: 127 mg/dL (calc) (ref <130)
Triglyceride: 100 mg/dL (ref <150)
Triglycerides: 100 mg/dL (ref <150)

## 2021-03-27 LAB — VITAMIN B12
Vitamin B-12: 305 pg/mL (ref 200–1100)
Vitamin B12: 305 pg/mL (ref 200–1100)

## 2021-03-27 LAB — VITAMIN D, 25 HYDROXY: VITAMIN D, 25-HYDROXY: 30 ng/mL (ref 30–100)

## 2021-03-27 LAB — TSH REFLEX TO T4F
TSH W/REFLEX TO FT4: 1.12 mIU/L (ref 0.40–4.50)
TSH W/REFLEX TO FT4: 1.12 mIU/L (ref 0.40–4.50)

## 2021-03-27 LAB — COMPREHENSIVE METABOLIC PANEL
ALT: 16 U/L (ref 6–29)
AST: 16 U/L (ref 10–35)
Albumin/Globulin Ratio: 2.1 (calc) (ref 1.0–2.5)
Albumin: 4.2 g/dL (ref 3.6–5.1)
Alkaline Phosphatase: 65 U/L (ref 37–153)
BUN: 16 mg/dL (ref 7–25)
CO2: 23 mmol/L (ref 20–32)
Calcium: 9.1 mg/dL (ref 8.6–10.4)
Chloride: 105 mmol/L (ref 98–110)
Creatinine: 0.85 mg/dL (ref 0.50–1.03)
Est, Glomerular Filtration Rate: 80 mL/min/{1.73_m2} (ref >=60)
Globulin: 2 g/dL (calc) (ref 1.9–3.7)
Glucose: 93 mg/dL (ref 65–99)
Potassium: 4 mmol/L (ref 3.5–5.3)
Sodium: 138 mmol/L (ref 135–146)
Total Bilirubin: 0.6 mg/dL (ref 0.2–1.2)
Total Protein: 6.2 g/dL (ref 6.1–8.1)

## 2021-03-27 LAB — CBC WITH AUTO DIFFERENTIAL
Basophils %: 0.4 % (ref 0–2)
Basophils Absolute: 27 cells/uL (ref 0–200)
Eosinophils %: 1.3 % (ref 0–8)
Eosinophils Absolute: 88 cells/uL (ref 15–500)
Hematocrit: 42.1 % (ref 35.0–45.0)
Hemoglobin: 13.7 g/dL (ref 11.7–15.5)
Lymphocytes %: 39.3 % (ref 15–49)
Lymphocytes Absolute: 2672 cells/uL (ref 850–3900)
MCH: 31.1 pg (ref 27.0–33.0)
MCHC: 32.5 g/dL (ref 32.0–36.0)
MCV: 95.5 fL (ref 80.0–100.0)
MPV: 11.2 fL (ref 7.5–12.5)
Monocytes %: 7.5 % (ref 0–13)
Monocytes Absolute: 510 cells/uL (ref 200–950)
Neutrophils %: 51.5 % (ref 38–80)
Neutrophils Absolute: 3502 cells/uL (ref 1500–7800)
Platelets: 237 10*3/uL (ref 140–400)
RBC: 4.41 10*6/uL (ref 3.80–5.10)
RDW: 12.8 % (ref 11.0–15.0)
WBC: 6.8 10*3/uL (ref 3.8–10.8)

## 2021-03-27 LAB — VITAMIN D 25 HYDROXY: Vit D, 25-Hydroxy: 30 ng/mL (ref 30–100)

## 2021-04-04 NOTE — Progress Notes (Signed)
Please tell patient that mammography is normal.  Repeat mammography in one year

## 2021-04-04 NOTE — Progress Notes (Signed)
Spoke with pt, results provided.

## 2021-04-25 ENCOUNTER — Ambulatory Visit
Admit: 2021-04-25 | Discharge: 2021-04-25 | Payer: PRIVATE HEALTH INSURANCE | Attending: Internal Medicine | Primary: Internal Medicine

## 2021-04-25 ENCOUNTER — Encounter

## 2021-04-25 DIAGNOSIS — Z0000 Encounter for general adult medical examination without abnormal findings: Secondary | ICD-10-CM

## 2021-04-25 MED ORDER — FLUTICASONE 50 MCG/ACTUATION NASAL SPRAY, SUSP
50 mcg/actuation | Freq: Every day | NASAL | 1 refills | Status: AC
Start: 2021-04-25 — End: ?

## 2021-04-25 MED ORDER — BUPROPION ER 150 MG TAB (SMOKING DETERRENT)
150 mg | ORAL_TABLET | Freq: Two times a day (BID) | ORAL | 1 refills | Status: DC
Start: 2021-04-25 — End: 2021-06-03

## 2021-04-25 NOTE — Progress Notes (Signed)
Chief Complaint:   Annual Exam      History of present illness:    Lindsay Liu is a 56 y.o. female with a past medical history as below who comes for annual complete physical exam.   Patient still smoking half pack of cigarettes a day and is finally interested in smoking cessation.  Has read up on both Chantix and Wellbutrin and would like to try Wellbutrin.  States that she also has been a little down since the death of her mother and feels that it may help.  Discussed administration and timeframe to smoking cessation.    Reviewed mammography and blood work.  Much improved with the addition of statin.    Up-to-date on recommended screenings and immunizations                3 most recent PHQ Screens 04/25/2021   Little interest or pleasure in doing things Not at all   Feeling down, depressed, irritable, or hopeless Not at all   Total Score PHQ 2 0   Trouble falling or staying asleep, or sleeping too much Not at all   Feeling tired or having little energy Not at all   Poor appetite, weight loss, or overeating Not at all   Feeling bad about yourself - or that you are a failure or have let yourself or your family down Not at all   Trouble concentrating on things such as school, work, reading, or watching TV Not at all   Moving or speaking so slowly that other people could have noticed; or the opposite being so fidgety that others notice Not at all   Thoughts of being better off dead, or hurting yourself in some way Not at all   PHQ 9 Score 0   How difficult have these problems made it for you to do your work, take care of your home and get along with others Not difficult at all       CAGE - [place] 04/25/2021   Have you ever felt the need to cut down on your drinking or drug use? No   Have people annoyed you by criticizing your drinking or drug use? 0   Have you ever felt guilty about drinking or drug use? 0   Have you ever had a drink or used drugs first thing in the morning to steady your nerves or to get rid of a  hangover (eye-opener)?  0   CAGE AID Score - >= 2 is considered clinically significant 0       CAGE-AID screening Negative, reviewed.    Review of Systems:   Review of Systems is negative for chest pain, chest pressure, nausea, vomiting, diarrhea, headache, fever, chills, hemoptysis, epistaxis, hematemesis, bright red blood per rectum, hematuria, dysuria, dyspnea, orthopnea, melena, depression, anxiety, or any recent unexplained weight loss or weight gain except as mentioned in HPI.      Prior to Admission medications    Medication Sig Start Date End Date Taking? Authorizing Provider   fluticasone propionate (Flonase) 50 mcg/actuation nasal spray 2 Sprays by Both Nostrils route daily. 04/25/21  Yes Domingo Mend, MD   buPROPion ER [facility]) 150 mg ER tablet Take 1 Tablet by mouth two (2) times a day. Indications: stop smoking 04/25/21  Yes Domingo Mend, MD   tacrolimus (PROTOPIC) 0.1 % ointment Apply  to affected area two (2) times a day. 03/19/21  Yes Domingo Mend, MD   atorvastatin (LIPITOR) 10 mg tablet Take 1  Tablet by mouth daily. 04/23/20  Yes Domingo Mend, MD   diclofenac potassium (CATAFLAM) 50 mg tablet Take 1 Tablet by mouth three (3) times daily. 03/29/20  Yes Domingo Mend, MD   Restasis 0.05 % dpet Administer 1 Drop to both eyes two (2) times a day. 06/10/19  Yes Provider, Historical   cetirizine HCl (ZYRTEC PO) Take  by mouth as needed.   Yes Provider, Historical            Allergies   Allergen Reactions    Levaquin [Levofloxacin] Shortness of Breath     Cipro in the past without adverse effect    Penicillin G Shortness of Breath           HISTORY:  Past Medical History:   Diagnosis Date    Cervical spine pain     Oh    Chronic urticaria     Endometriosis     Hypercholesterolemia     Stye 10/2016    Dr Otho Perl -[place]    Thyroid disease        Past Surgical History:   Procedure Laterality Date    HX APPENDECTOMY      HX CHOLECYSTECTOMY  01/1994    HX LAPAROTOMY         Family  History   Problem Relation Age of Onset    Elevated Lipids Mother     Heart Disease Mother     Cancer Mother 90        multiple myeloma    Heart Disease Father     No Known Problems Sister     Elevated Lipids Brother     Elevated Lipids Brother     No Known Problems Daughter     No Known Problems Son        Social History     Tobacco Use    Smoking status: Every Day     Packs/day: 0.50     Years: 30.00     Pack years: 15.00     Types: Cigarettes    Smokeless tobacco: Never   Vaping Use    Vaping Use: Never used   Substance Use Topics    Alcohol use: Yes     Alcohol/week: 1.0 standard drink     Types: 1 Standard drinks or equivalent per week     Comment: social    Drug use: No       Immunization History   Administered Date(s) Administered    COVID-19, MODERNA BLUE border, Primary or Immunocompromised, (age 23y+), IM, 100 mcg/0.38m 07/12/2019, 08/09/2019, 02/20/2020    COVID-19, MODERNA Bivalent BOOSTER, (age 23y+), IM, 50 mcg/0.5 mL 01/04/2021    COVID-19, MODERNA Booster BLUE border, (age 23y+), IM, 5480m/0.280mL 01/04/2021    Influenza Vaccine 01/04/2021    Influenza Vaccine PF 02/24/2013    Influenza, AFLURIA (age 51-20-35 mo IM, MDV, 0.25 mL, Fluzone (age 51 32o+), AFLURIA (age 84 71+), IM, MDV, 0.80m69m9/23/2016, 01/14/2017    Influenza, FLUBLOK, (age 23 74), PF 01/28/2018, 03/02/2019, 03/29/2020    Influenza, FLUCELVAX, (age 51 m41+), MDCK, PF 01/04/2021    Pneumococcal Polysaccharide (PPSV-23) 01/14/2017    Tdap 01/05/2015    Zoster Recombinant 01/10/2021, 03/26/2021       Patient Care Team:  FerDomingo MendD as PCP - General (Internal Medicine Physician)  FerDomingo MendD [facility]  Oh, ChoPercival SpanishD (Orthopedic Surgery)  GilEverardo PacificP  MolLujean AmelD (Gastroenterology)  Comer Locket, MD (Ophthalmology)  Michele Mcalpine, MD (Obstetrics & Gynecology)  Darlin Coco, MD (Endocrinology Physician)      Objective:   Vitals:          Vitals:    04/25/21 1239   BP: 104/70   Pulse: 77    Temp: 98 ??F (36.7 ??C)   TempSrc: Temporal   SpO2: 99%   Weight: 169 lb (76.7 kg)   Height: _0  (1.6 m)       PHYSICAL EXAM:    General:    Alert, cooperative, no distress, appears stated age.     HEENT: Atraumatic, anicteric sclerae, pink conjunctivae     No oral ulcers, mucosa moist, throat clear  Neck:  Supple, symmetrical,  thyroid: non tender  Back:    No CVA tenderness.  Lungs:   Clear to auscultation bilaterally.  No Wheezing or Rhonchi. No rales.  Chest wall:  No tenderness  No Accessory muscle use.  Heart:   Regular  rhythm,  No  murmur   No gallop.  No edema  Abdomen:   Soft, non-tender. Not distended.  Bowel sounds normal. No masses  Extremities: No cyanosis.  No clubbing No edema  Skin:     Not pale Not Jaundiced  No rashes   Lymph nodes: Cervical, supraclavicular normal.  Psych:  Good insight.  Not depressed.  Not anxious or agitated.  Neurologic: EOMs intact. No facial asymmetry. No aphasia or slurred speech. Normal   strength, Alert and oriented X 3.     Lab Data Reviewed:   Office Visit on 04/25/2021   Component Date Value Ref Range Status    Pap Smear, External 03/28/2020 nl   Final   Orders Only on 03/26/2021   Component Date Value Ref Range Status    Cholesterol, total 03/26/2021 174  <200 mg/dL Final    HDL Cholesterol 03/26/2021 47 (A)  > OR = 50 mg/dL Final    Triglyceride 03/26/2021 100  <150 mg/dL Final    LDL-CHOLESTEROL 03/26/2021 107 (A)  mg/dL (calc) Final    Comment: Reference range: <100     Desirable range <100 mg/dL for primary prevention;    <70 mg/dL for patients with CHD or diabetic patients   with > or = 2 CHD risk factors.     LDL-C is now calculated using the Martin-Hopkins   calculation, which is a validated novel method providing   better accuracy than the Friedewald equation in the   estimation of LDL-C.   Cresenciano Genre et al. Annamaria Helling. 1694;503(88): 2061-2068   (http://education.[facility].com/faq/FAQ164)      Cholesterol/HDL ratio 03/26/2021 3.7  <5.0 (calc) Final     Non-HDL Cholesterol 03/26/2021 127  <130 mg/dL (calc) Final    Comment: For patients with diabetes plus 1 major ASCVD risk   factor, treating to a non-HDL-C goal of <100 mg/dL   (LDL-C of <70 mg/dL) is considered a therapeutic   option.      Glucose 03/26/2021 93  65 - 99 mg/dL Final    Comment:               Fasting reference interval         BUN 03/26/2021 16  7 - 25 mg/dL Final    Creatinine 03/26/2021 0.85  0.50 - 1.03 mg/dL Final    eGFR 03/26/2021 80  > OR = 60 mL/min/1.41m Final    Comment: The eGFR is based on the CKD-EPI 2021 equation. To calculate   the new  eGFR from a previous Creatinine or Cystatin C  result, go to https://www.kidney.org/professionals/  kdoqi/gfr%5Fcalculator      BUN/Creatinine ratio 78/58/8502 NOT APPLICABLE  6 - 22 (calc) Final    Sodium 03/26/2021 138  135 - 146 mmol/L Final    Potassium 03/26/2021 4.0  3.5 - 5.3 mmol/L Final    Chloride 03/26/2021 105  98 - 110 mmol/L Final    CO2 03/26/2021 23  20 - 32 mmol/L Final    Calcium 03/26/2021 9.1  8.6 - 10.4 mg/dL Final    Protein, total 03/26/2021 6.2  6.1 - 8.1 g/dL Final    Albumin 03/26/2021 4.2  3.6 - 5.1 g/dL Final    Globulin 03/26/2021 2.0  1.9 - 3.7 g/dL (calc) Final    ALB/GLOBRATIO 03/26/2021 2.1  1.0 - 2.5 (calc) Final    Bilirubin, total 03/26/2021 0.6  0.2 - 1.2 mg/dL Final    Alkaline Phosphatase, total 03/26/2021 65  37 - 153 U/L Final    AST (SGOT) 03/26/2021 16  10 - 35 U/L Final    ALT (SGPT) 03/26/2021 16  6 - 29 U/L Final    VITAMIN D, 25-HYDROXY 03/26/2021 30  30 - 100 ng/mL Final    Comment: Vitamin D Status         25-OH Vitamin D:     Deficiency:                    <20 ng/mL  Insufficiency:             20 - 29 ng/mL  Optimal:                 > or = 30 ng/mL     For 25-OH Vitamin D testing on patients on   D2-supplementation and patients for whom quantitation   of D2 and D3 fractions is required, the [facility]TM)  25-OH VIT D, (D2,D3), LC/MS/MS is recommended: order   code [id number] (patients >56yr).  See Note  1     Note 1     For additional information, please refer to   http://education.[facility].com/faq/FAQ199   (This link is being provided for informational/  educational purposes only.)      TSH W/REFLEX TO FT4 03/26/2021 1.12  0.40 - 4.50 mIU/L Final    WBC 03/26/2021 6.8  3.8 - 10.8 Thousand/uL Final    RBC 03/26/2021 4.41  3.80 - 5.10 Million/uL Final    HGB 03/26/2021 13.7  11.7 - 15.5 g/dL Final    HCT 03/26/2021 42.1  35.0 - 45.0 % Final    MCV 03/26/2021 95.5  80.0 - 100.0 fL Final    MCH 03/26/2021 31.1  27.0 - 33.0 pg Final    MCHC 03/26/2021 32.5  32.0 - 36.0 g/dL Final    RDW 03/26/2021 12.8  11.0 - 15.0 % Final    PLATELET 03/26/2021 237  140 - 400 Thousand/uL Final    MEAN PLATELET VOLUME 03/26/2021 11.2  7.5 - 12.5 fL Final    ABS. NEUTROPHILS 03/26/2021 3,502  1,500 - 7,800 cells/uL Final    ABS. LYMPHOCYTES 03/26/2021 2,672  850 - 3,900 cells/uL Final    ABS. MONOCYTES 03/26/2021 510  200 - 950 cells/uL Final    ABS. EOSINOPHILS 03/26/2021 88  15 - 500 cells/uL Final    ABS. BASOPHILS 03/26/2021 27  0 - 200 cells/uL Final    Neutrophils 03/26/2021 51.5  38 - 80 % Final    LYMPHOCYTES 03/26/2021 39.3  15 -  49 % Final    MONOCYTES 03/26/2021 7.5  0 - 13 % Final    EOSINOPHILS 03/26/2021 1.3  0 - 8 % Final    BASOPHILS 03/26/2021 0.4  0 - 2 % Final    Color 03/26/2021 YELLOW  YELLOW Final    Appearance 03/26/2021 CLOUDY (A)  CLEAR Final    Specific gravity 03/26/2021 1.023  1.001 - 1.035 Final    pH (UA) 03/26/2021 5.5  5.0 - 8.0 Final    Glucose 03/26/2021 NEGATIVE  NEGATIVE Final    Bilirubin 03/26/2021 NEGATIVE  NEGATIVE Final    Ketone 03/26/2021 NEGATIVE  NEGATIVE Final    Blood 03/26/2021 1+ (A)  NEGATIVE Final    Protein 03/26/2021 NEGATIVE  NEGATIVE Final    Nitrites 03/26/2021 NEGATIVE  NEGATIVE Final    Leukocyte Esterase 03/26/2021 1+ (A)  NEGATIVE Final    WBC 03/26/2021 0-5  0 - 5 /HPF Final    RBC 03/26/2021 0-2  0 - 2 /HPF Final    SQUAMOUS EPITHELIAL CELLS 03/26/2021 10-20 (A)  < OR  = 5 /HPF Final    Bacteria 03/26/2021 FEW (A)  NONE SEEN /HPF Final    Hyaline cast 03/26/2021 NONE SEEN  NONE SEEN /LPF Final    NOTE 03/26/2021    Final    Comment: This urine was analyzed for the presence of WBC,   RBC, bacteria, casts, and other formed elements.   Only those elements seen were reported.            Vitamin B12 03/26/2021 305  200 - 1,100 pg/mL Final    Comment:    Please Note: Although the reference range for vitamin  B12 is 9898524145 pg/mL, it has been reported that between  5 and 10% of patients with values between 200 and 400  pg/mL may experience neuropsychiatric and hematologic  abnormalities due to occult B12 deficiency; less than 1%  of patients with values above 400 pg/mL will have symptoms.               EKG   Results for orders placed or performed in visit on 04/25/21   [facility] PAP SMEAR   Result Value Ref Range    Pap Smear, External nl          Health Maintenance   Topic Date Due    COVID-19 Vaccine (5 - Booster for Moderna series) 03/01/2021    Depression Screen  09/20/2021    Lipid Screen  03/26/2022    Cervical cancer screen  03/29/2023    Breast Cancer Screen Mammogram  04/04/2023    DTaP/Tdap/Td series (2 - Td or Tdap) 01/04/2025    Colorectal Cancer Screening Combo  07/18/2025    Bone Densitometry (Dexa) Screening  02/22/2029    Hepatitis C Screening  Completed    Shingles Vaccine  Completed    Flu Vaccine  Completed    Pneumococcal 0-64 years  Completed         Assessment/Plan:   Healthy but overweight 56 year old female    1. Well woman exam (no gynecological exam)  Up-to-date on recommended screenings and immunizations    2. Current every day smoker  Will start Wellbutrin.  Patient will initially start once daily and then increase to twice daily.  Follow-up in 6 weeks.  Counseling given  - buPROPion ER (ZYBAN,BUPROBAN) 150 mg ER tablet; Take 1 Tablet by mouth two (2) times a day. Indications: stop smoking  Dispense: 60 Tablet; Refill: 1  - SMOKING  AND TOBACCO USE CESSATION 3 - 10  MINUTES    3. Hyperlipidemia, unspecified hyperlipidemia type  Continue statin    4.  Rhinitis    - fluticasone propionate (Flonase) 50 mcg/actuation nasal spray; 2 Sprays by Both Nostrils route daily.  Dispense: 16 g; Refill: 1        The patient is advised to quit smoking, begin progressive daily aerobic exercise program, follow a low fat, low cholesterol diet, attempt to lose weight, reduce exposure to stress, improve dietary compliance, continue current healthy lifestyle patterns, and return for routine annual checkups.  Discussed the patient's I have reviewed/discussed the above normal BMI with the patient.  I have recommended the following interventions: dietary management education, guidance, and counseling and encourage exercise .       Care Plan discussed with:   _0 Patient   _1 Family    _2 Care Manager    _3 Nursing   _4 Consultant/Specialist :   _5   >50% of visit spent in counseling and coordination of care       Discussed     _6 Advanced Directives,  _7  smoking cessation   _8  diet   _9 excercise   _10 weight loss recommended  _11 seat belts  _12 sun screen usage  _13 safe sex practices  [  ] texting while driving  [  ]cell phone use while driving_14  medication usage, compliance, and side effects          Follow up in   Follow-up and Dispositions    Return in about 6 weeks (around 06/06/2021) for follow up.               Note: This report was transcribed using voice recognition software and is thus   prone to syntax and contextual spelling errors. Please do not hesitate to call   me if anything needs clarification or a dictated addendum         ___________________________________________________     Domingo Mend, MD     ___________________________________________________

## 2021-04-28 ENCOUNTER — Encounter

## 2021-04-29 MED ORDER — ATORVASTATIN 10 MG TAB
10 mg | ORAL_TABLET | Freq: Every day | ORAL | 1 refills | Status: DC
Start: 2021-04-29 — End: 2021-08-26

## 2021-04-29 NOTE — Telephone Encounter (Signed)
Telephone Encounter by Pricilla Handler, MD at 04/29/21 0957                Author: Pricilla Handler, MD  Service: --  Author Type: Physician       Filed: 04/29/21 0957  Encounter Date: 04/28/2021  Status: Signed          Editor: Pricilla Handler, MD (Physician)          From: Mayra Neer: Pricilla Handler, MDSent: 04/28/2021 11:33 AM ESTSubject: Refill not received by pharmacy [facility] never  received the Atorvastatin refill sent by you last week. Can you please resend it? I believe they sent another request to you today.  Best, Shirl Harris

## 2021-06-03 ENCOUNTER — Ambulatory Visit
Admit: 2021-06-03 | Discharge: 2021-06-03 | Payer: PRIVATE HEALTH INSURANCE | Attending: Internal Medicine | Primary: Internal Medicine

## 2021-06-03 ENCOUNTER — Ambulatory Visit: Attending: Internal Medicine | Primary: Internal Medicine

## 2021-06-03 DIAGNOSIS — Z716 Tobacco abuse counseling: Secondary | ICD-10-CM

## 2021-06-03 MED ORDER — CYCLOBENZAPRINE 5 MG TAB
5 mg | ORAL_TABLET | Freq: Three times a day (TID) | ORAL | 0 refills | Status: AC | PRN
Start: 2021-06-03 — End: ?

## 2021-06-03 MED ORDER — DICLOFENAC 50 MG TAB
50 mg | ORAL_TABLET | Freq: Three times a day (TID) | ORAL | 1 refills | Status: AC
Start: 2021-06-03 — End: ?

## 2021-06-03 NOTE — Progress Notes (Signed)
Lindsay Liu (DOB: [birth year]) is a 56 y.o. female, established patient, here for evaluation of the following chief complaint(s):  No chief complaint on file.                 SUBJECTIVE/OBJECTIVE:  HPI  Patient comes for follow-up of smoking cessation.  At last visit she was started on bupropion.  She states that she stopped smoking on January 24.  She states that she has not gained weight as she is concentrating on exercising and dieting still.  She states that she still has some days when she really wants a cigarette but is able to overcome that.  She is avoiding situations where she would normally smoke [place]    Has intermittent upper back pain.  Requesting renewal on diclofenac as well as a muscle relaxant as she feels some spasm because she slept wrong last night.   Review of Systems    Physical Exam  Visit Vitals  BP 110/70   Pulse 80   Resp 14   Ht 5\' 3"  (1.6 m)   Wt 164 lb (74.4 kg)   BMI 29.05 kg/m??       ASSESSMENT/PLAN:    1. Encounter for smoking cessation counseling  2. Hyperlipidemia, unspecified hyperlipidemia type  3. Chronic thoracic back pain, unspecified back pain laterality  -     diclofenac potassium (CATAFLAM) 50 mg tablet; Take 1 Tablet by mouth three (3) times daily., Normal, Disp-60 Tablet, R-1  -     cyclobenzaprine (FLEXERIL) 5 mg tablet; Take 1 Tablet by mouth three (3) times daily as needed for Muscle Spasm(s). Indications: muscle spasm, Normal, Disp-30 Tablet, R-0  Continue bupropion.  Discussed tapering schedule in 3 months if doing well.  Patient states that it is also helped the depression that she was having after her mother's death.  Also discussed consideration for discontinuation of Lipitor once weight is down to 150 with rechecking of cholesterol in 2 to 3 months after discontinuing        An electronic signature was used to authenticate this note.  -- , MD

## 2021-06-06 ENCOUNTER — Encounter: Attending: Internal Medicine | Primary: Internal Medicine

## 2021-07-22 MED ORDER — CYCLOBENZAPRINE 5 MG TAB
5 mg | ORAL_TABLET | Freq: Three times a day (TID) | ORAL | 3 refills | Status: AC | PRN
Start: 2021-07-22 — End: ?

## 2021-08-26 MED ORDER — ATORVASTATIN 10 MG TAB
10 mg | ORAL_TABLET | Freq: Every day | ORAL | 2 refills | Status: AC
Start: 2021-08-26 — End: ?

## 2022-05-29 IMAGING — MG MAMMOGRAPHY SCREENING BILATERAL 3[PERSON_NAME]
8 series · 8 of 24 positions shown · non-contrast
Comparison: Delay in the report was to obtain prior exams for comparison. They 
remain unavailable for comparison at the time of this dictation.

________________________________________________________________________________________________ 
MAMMOGRAPHY SCREENING BILATERAL 3KOUISSI RAJLI, 05/29/2022 [DATE]: 
CLINICAL INDICATION: Encounter for screening mammogram.
TECHNIQUE: Digital bilateral mammograms and 3-D Tomosynthesis were obtained. 
These were interpreted both primarily and with the aid of computer-aided 
detection system.  
BREAST DENSITY: (Level B) There are scattered areas of fibroglandular density.

[R CC]
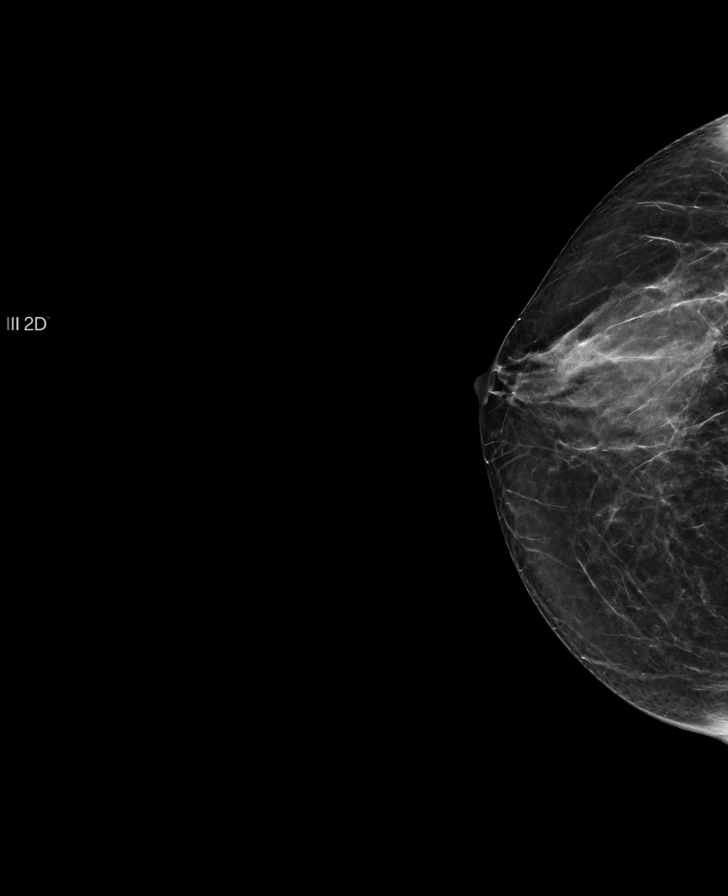

[L MLO]
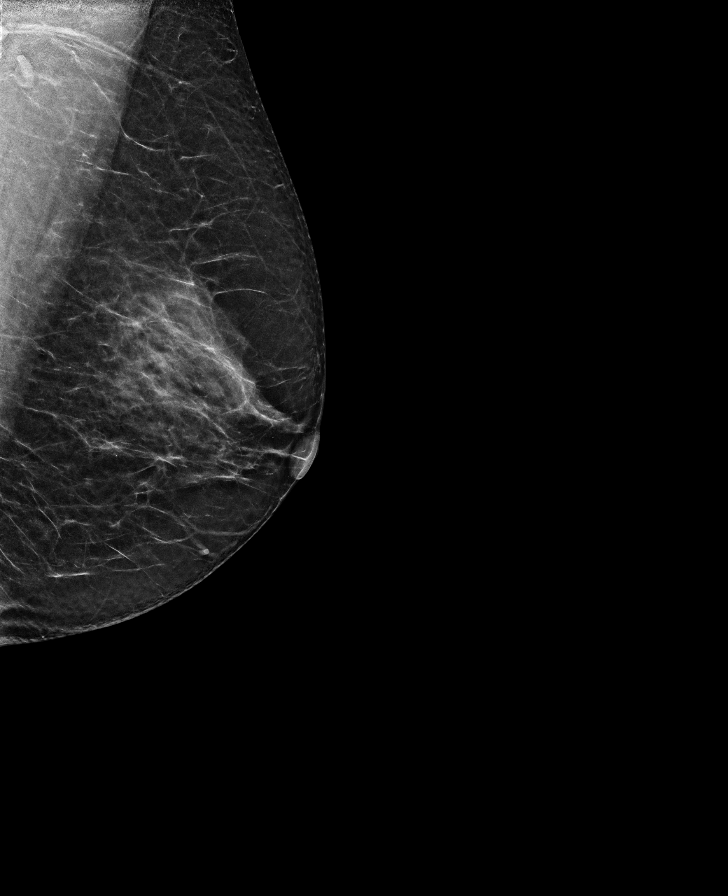

[L CC]
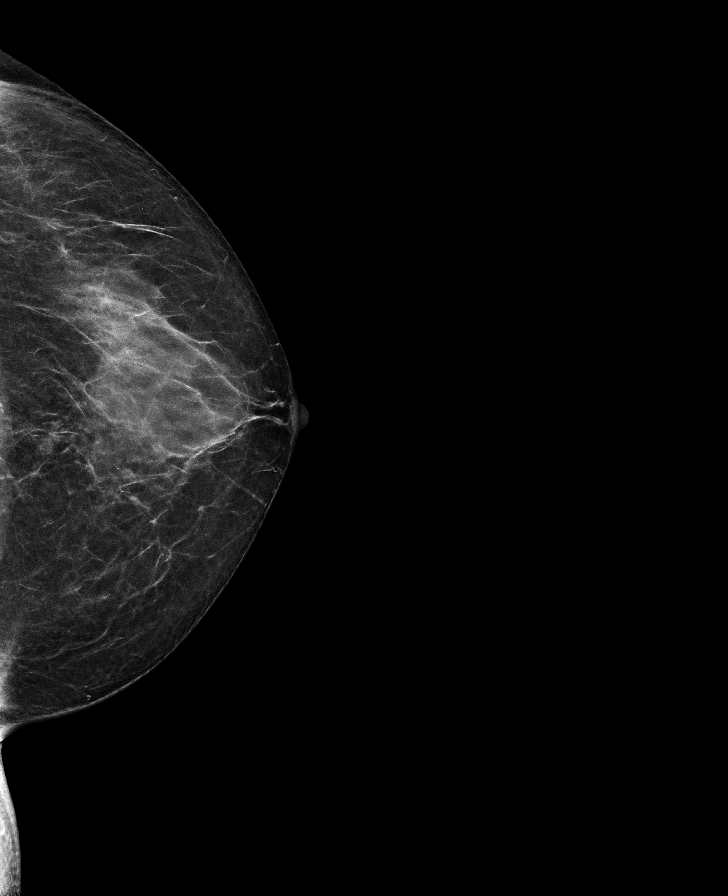

[R MLO]
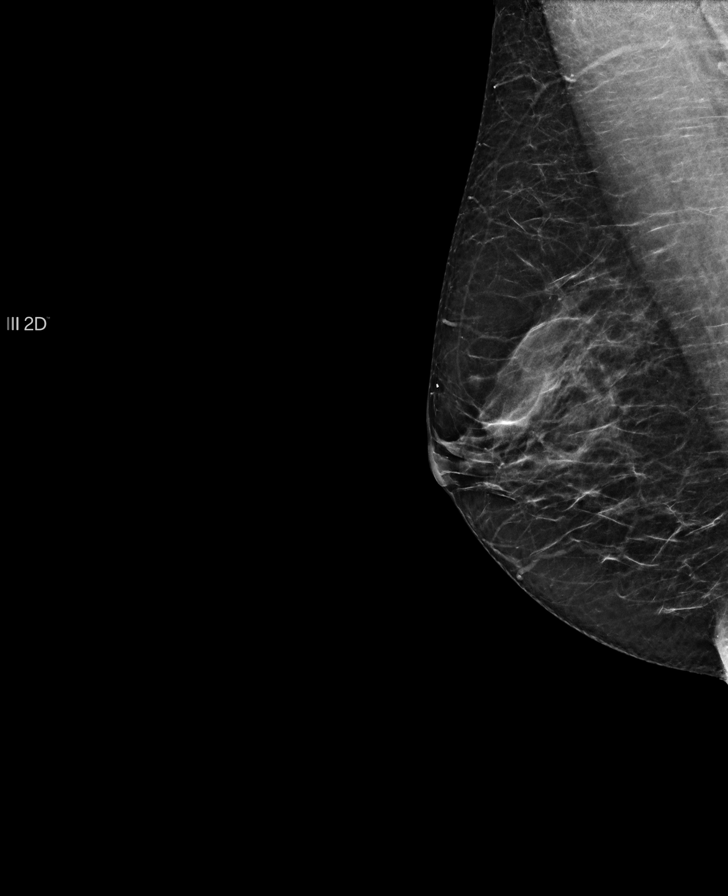

[R MLO tomo · tomo slice 29/58.0]
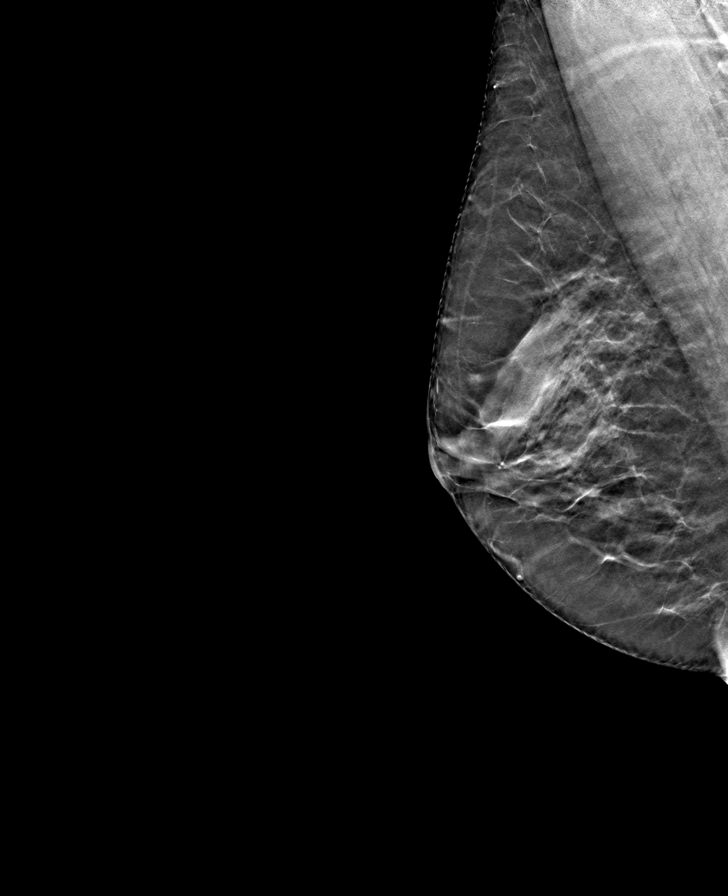

[L MLO tomo · tomo slice 30/59.0]
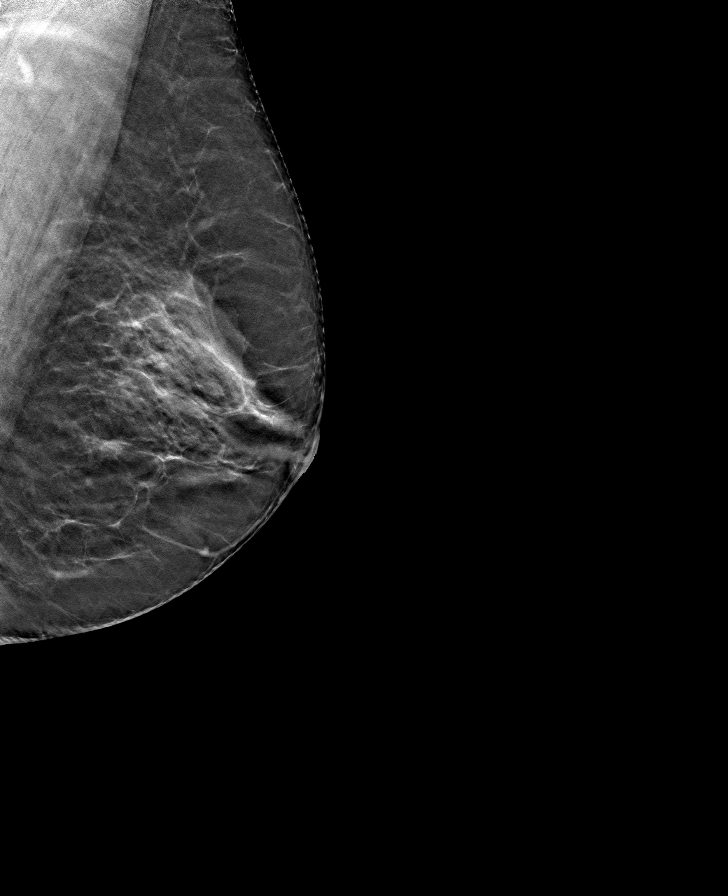

[L CC tomo · tomo slice 32/63.0]
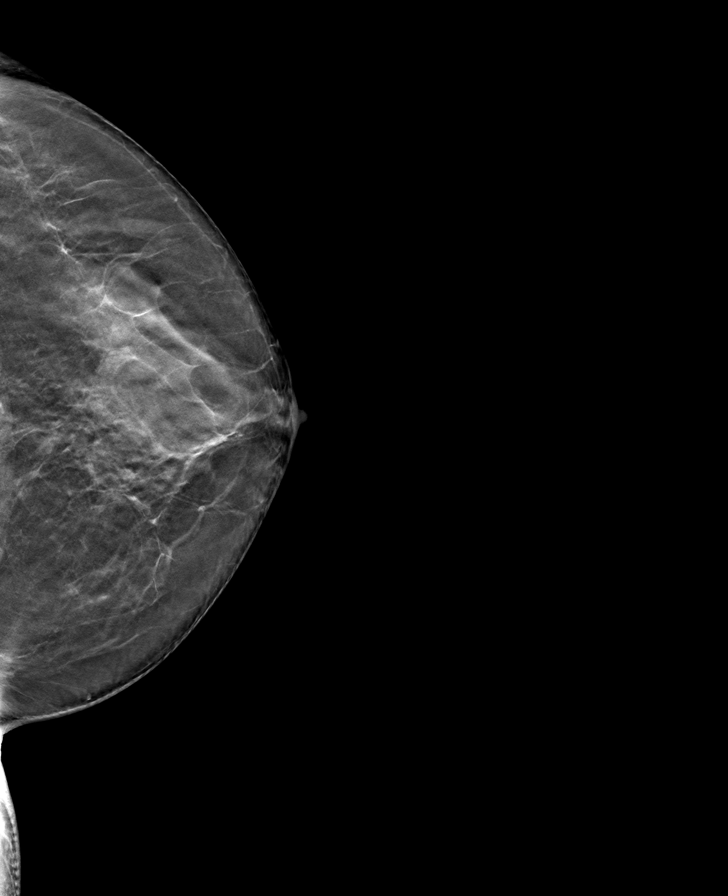

[R CC tomo · tomo slice 27/54.0]
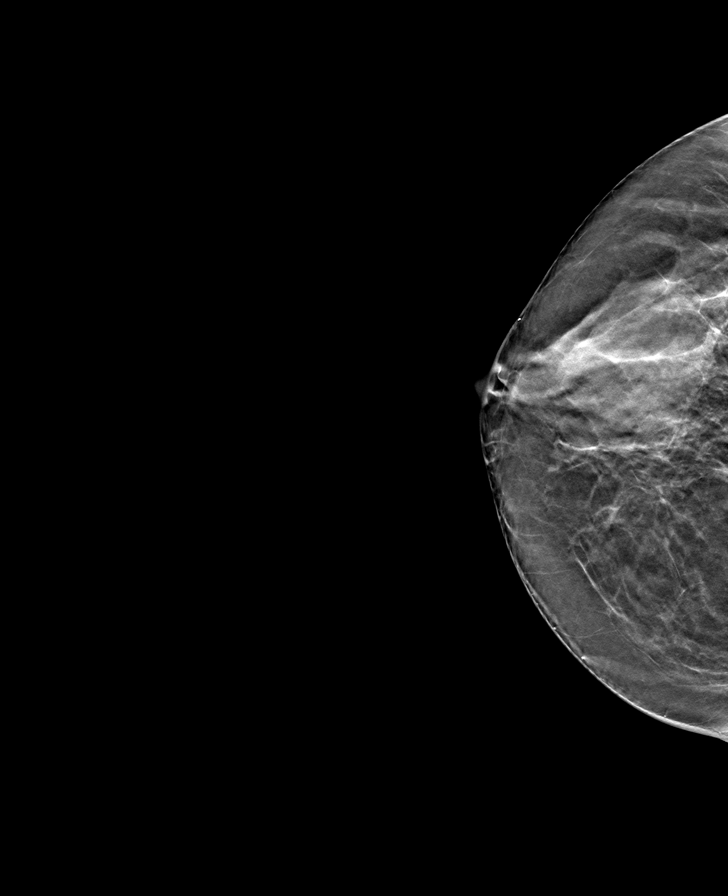

[8 of 24 positions shown; findings below may reference images not displayed]

FINDINGS: No suspicious mass, calcifications, or area of architectural 
distortion in either breast.
IMPRESSION: No mammographic findings suggestive for malignancy. 
(BI-RADS 2) Benign findings. Routine mammographic follow-up is recommended.

## 2022-07-12 IMAGING — MR MRI CERVICAL SPINE WITHOUT CONTRAST
5 of 7 series · 22 of 48 positions shown · IV contrast (gadolinium)
Comparison: None

________________________________________________________________________________________________ 
MRI CERVICAL SPINE WITHOUT CONTRAST, 07/12/2022 [DATE]: 
CLINICAL INDICATION: Radiculopathy, bilateral leg and arm pain, worse on left 
with numbness
TECHNIQUE: Sagittal T1, Sagittal T2, Sagittal STIR, Axial TSE and Axial [id number] 
images of the cervical spine were performed without intravenous gadolinium 
enhancement.

[Series 101: survey · axial · 10.0mm · 1.25mm/px · z∈[-6,+200]mm · 3 of 10 slices shown]
[im 1/10]
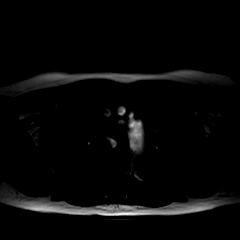
[im 5/10]
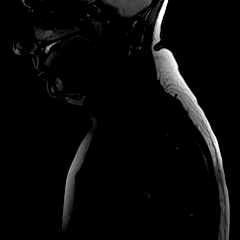
[im 10/10]
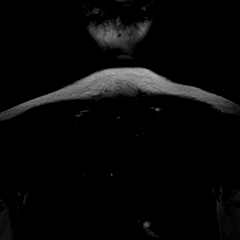

[Series 201: t2w_cor-surv · coronal · 5.0mm · 0.69mm/px · 3 of 7 slices shown]
[im 1/7]
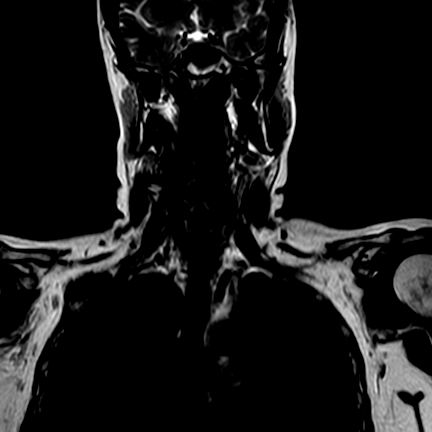
[im 4/7]
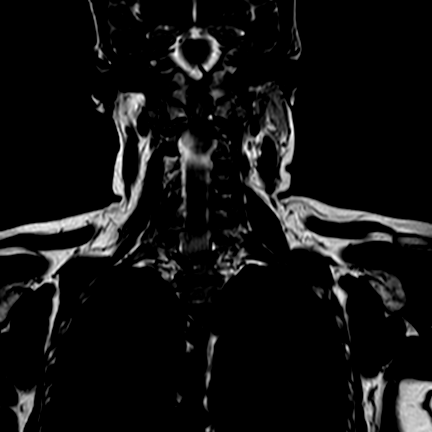
[im 7/7]
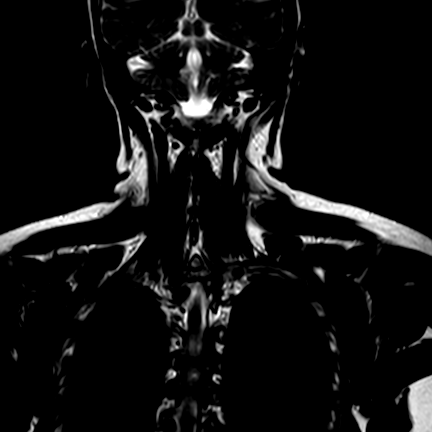

[Series 301: T1 · sagittal · 3.0mm · 0.39mm/px · 6 of 15 slices shown]
[im 1/15]
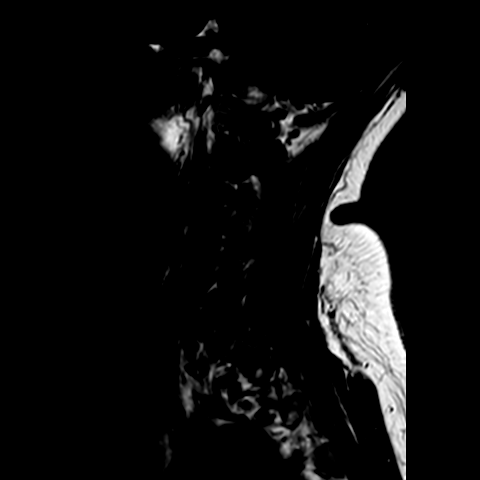
[im 3/15]
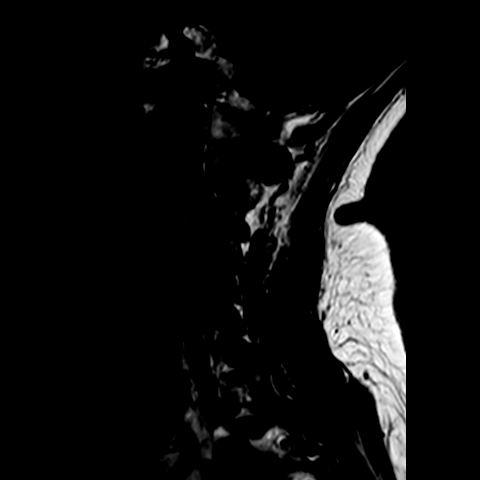
[im 6/15]
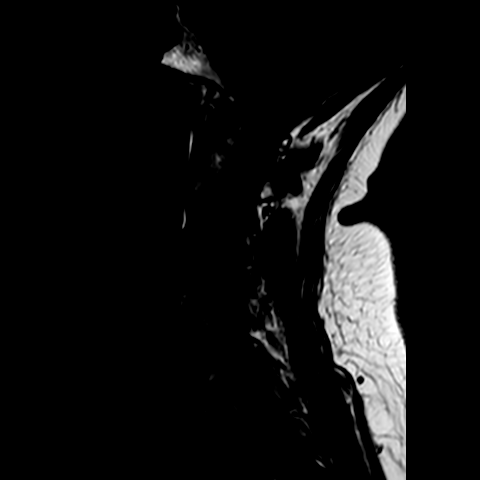
[im 9/15]
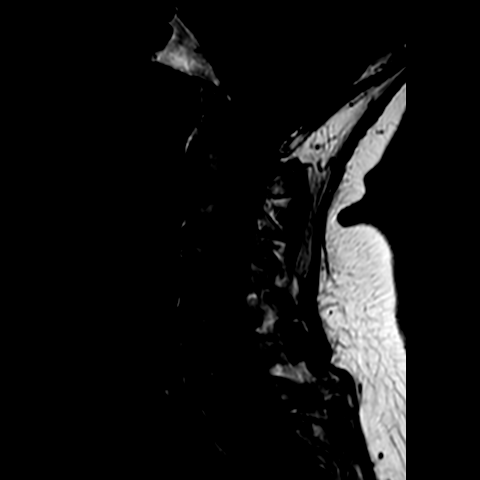
[im 12/15]
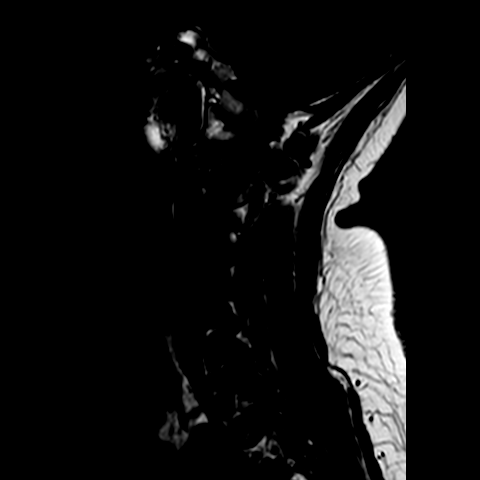
[im 15/15]
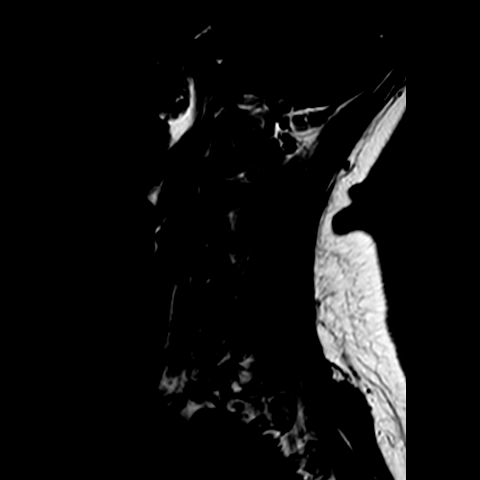

[Series 402: (id)_mdixon_tse · sagittal · 3.0mm · 0.35mm/px · 1 of 15 slices shown]
[im 1/15]
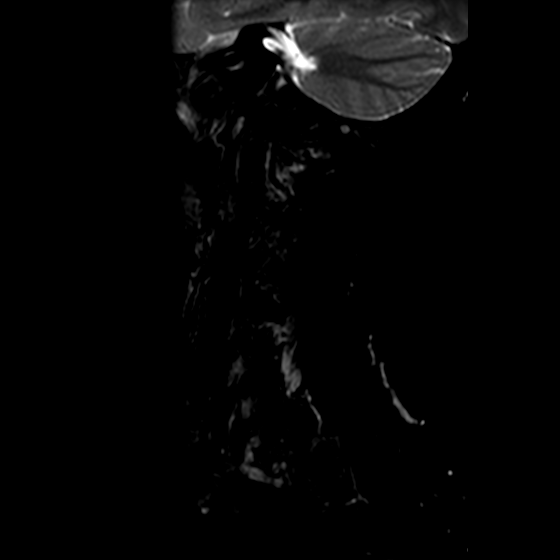

[Series 601: T2 · axial · 3.0mm · 0.31mm/px · z∈[+11,+103]mm · 9 of 31 slices shown]
[im 1/31]
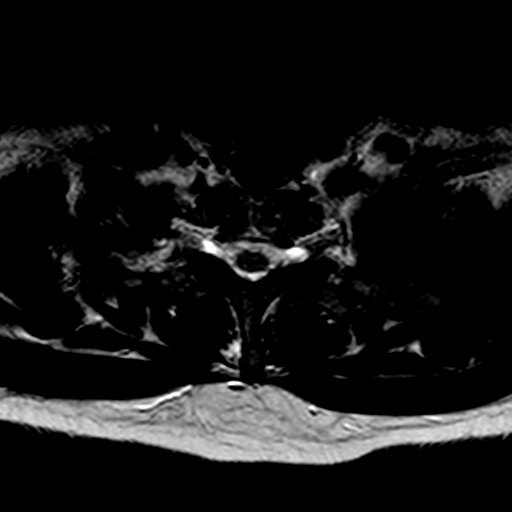
[im 6/31]
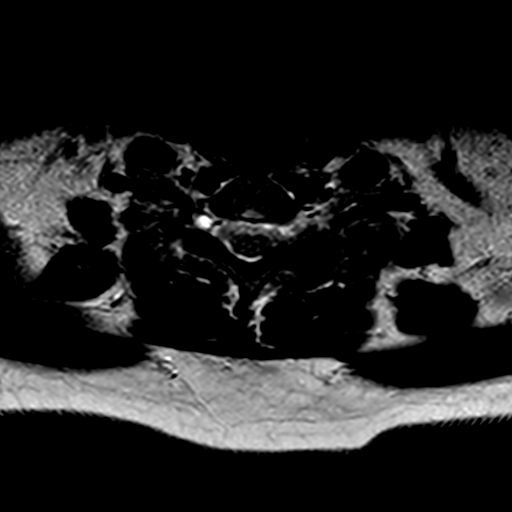
[im 9/31]
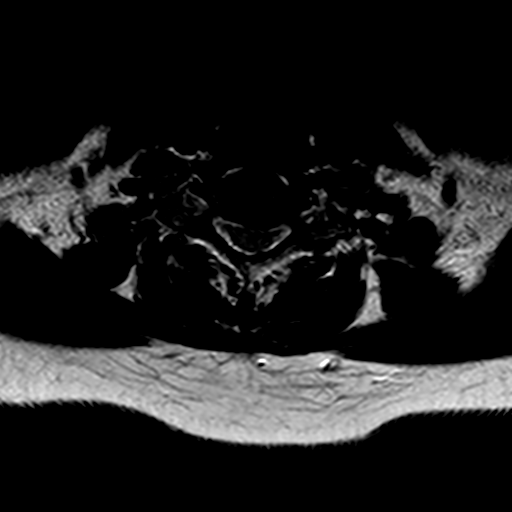
[im 14/31]
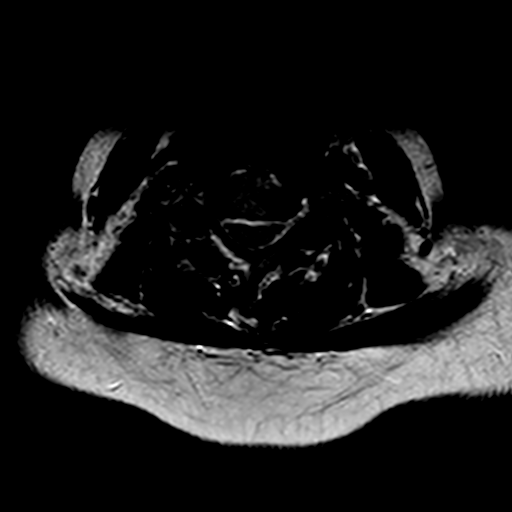
[im 17/31]
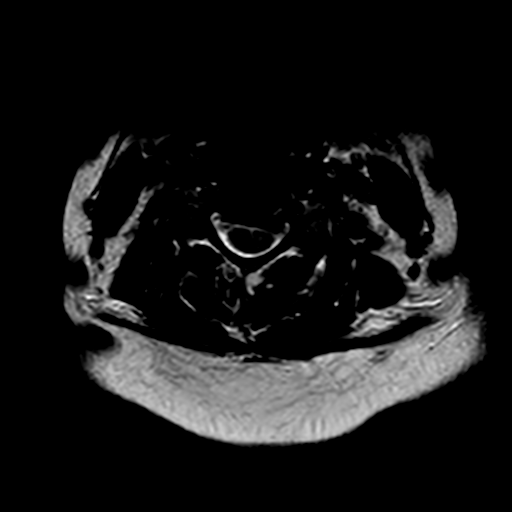
[im 22/31]
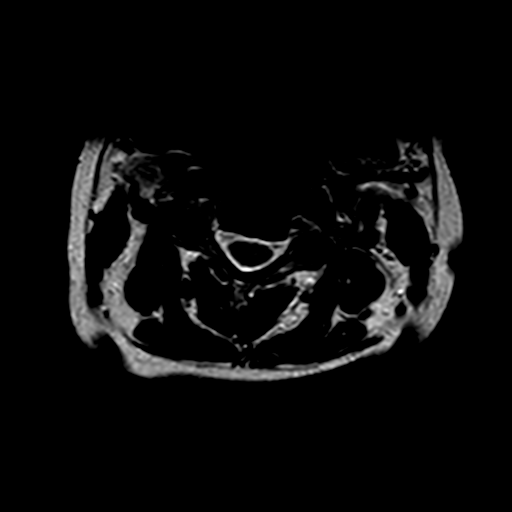
[im 25/31]
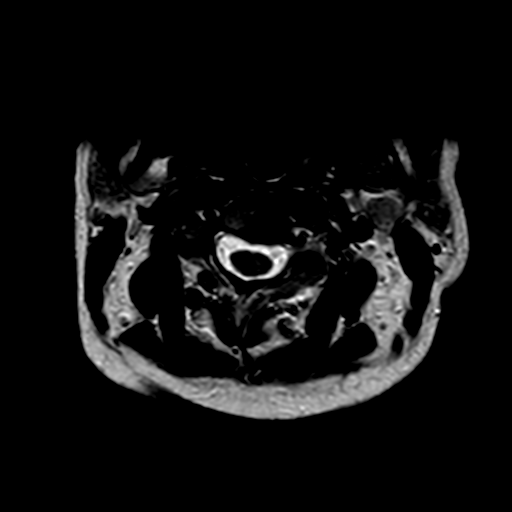
[im 28/31]
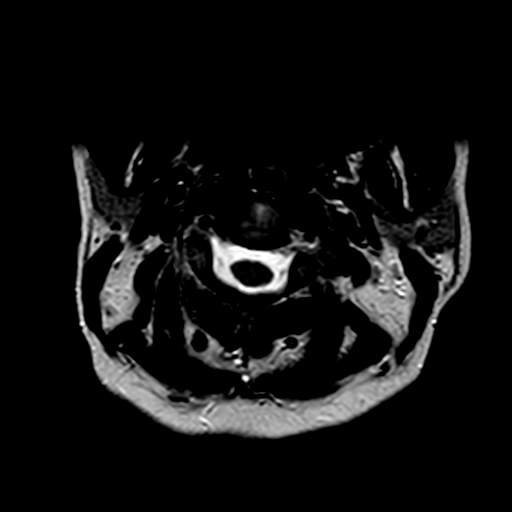
[im 31/31]
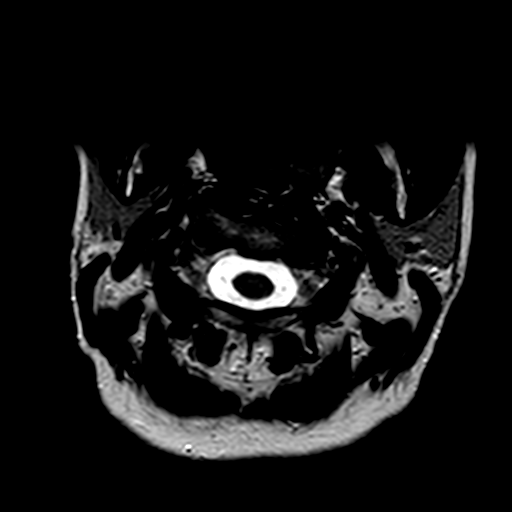

[22 of 48 positions shown; findings below may reference images not displayed]

FINDINGS: Cervical vertebral heights are intact. There is spondylosis 
superimposed on short posterior elements. There is marked disc narrowing at C5-6 
and C6-7, with moderate to marked narrowing at C3-4 and C4-5. Lordosis is 
preserved. Cord signal is normal. The dens is intact. The craniocervical 
junction is open area there is no evidence for spinal malignancy. There is mild 
Modic type I change at C4-5 and C5-6. 
At C2-3 the canal and foramina are open. The graft at C3-4 there is mild disc 
bulge slightly separated from the cord. The canal is borderline narrow. Mild 
left foraminal stenosis. 
At C4-5 disc-osteophyte approximates the cord without deformity. Mild canal 
stenosis. Moderate-marked left, mild to moderate right foraminal stenosis, axial 
image 18. 
At C5-6 there is mild disc-osteophyte and dorsal ligamentous thickening, 
contributing to mild canal stenosis. There is marked right, mild left foraminal 
stenosis, axial image 14. 
At C6-7 disc-osteophyte mildly deforms the ventral cord. Canal diameter is 
reduced to 5.5 mm in the left of midline, mid sagittal canal diameter 8 mm. 
There is mild to moderate left foraminal stenosis. Right foramen open. 
At C7-T1 the canal and foramina are open.
IMPRESSION: Spondylosis superimposed on short posterior elements. There is mild to moderate 
canal stenosis with mild effacement of the left ventral cord at C6-7. Mild canal 
stenosis at C4-5 and C5-6. 
Multilevel foraminal stenosis as described, most pronounced on the left at C4-5, 
on the right at C5-6. 
Cord signal is normal. 
Mild upper thoracic levoscoliosis.

## 2023-05-07 IMAGING — DX LUMBAR SPINE COMPLETE 4 VIEWS
4 series · 4 of 4 positions shown · non-contrast
Comparison: None

________________________________________________________________________________________________ 
LUMBAR SPINE COMPLETE 4 VIEWS, 05/07/2023 [DATE]: 
CLINICAL INDICATION: Radiculopathy, Lumbar Region

[AP]
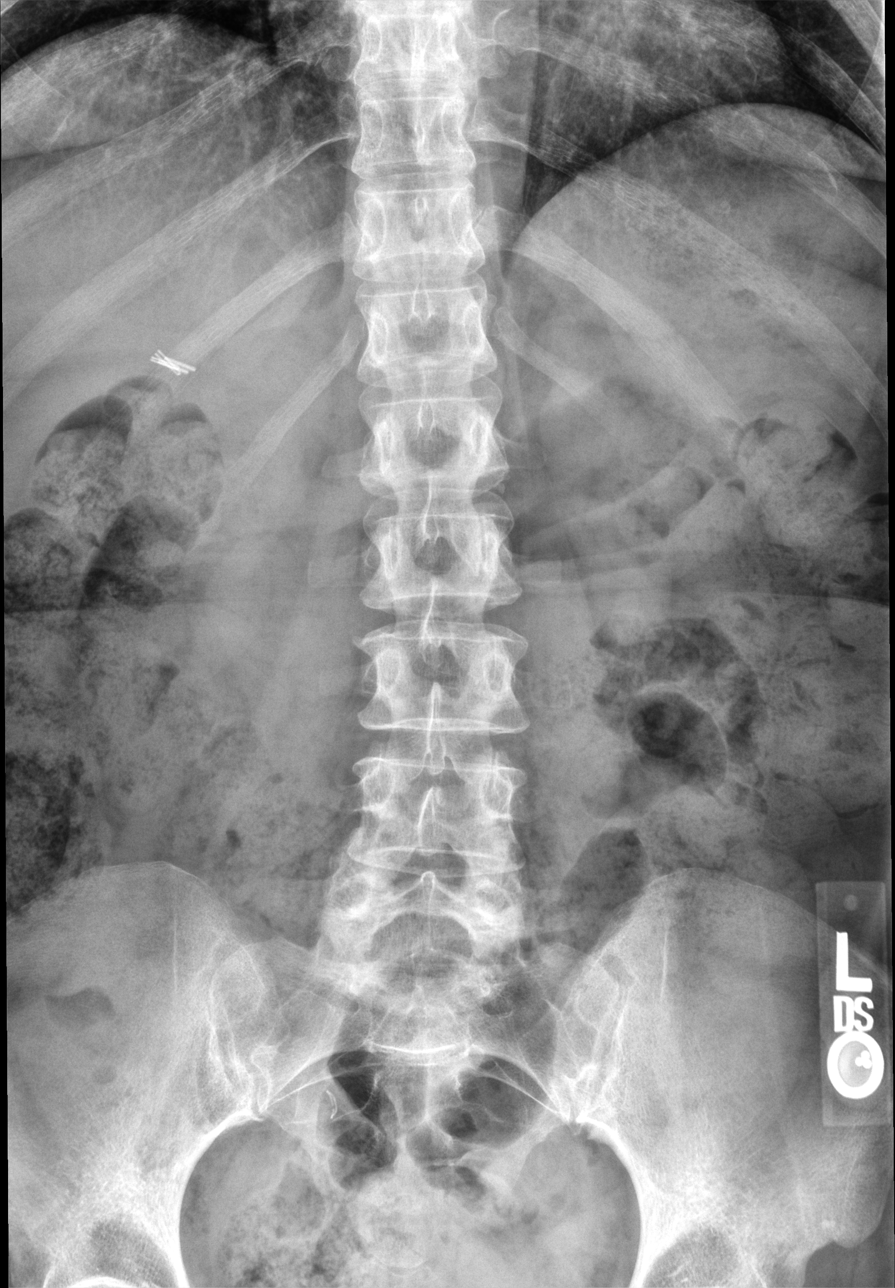

[rpo]
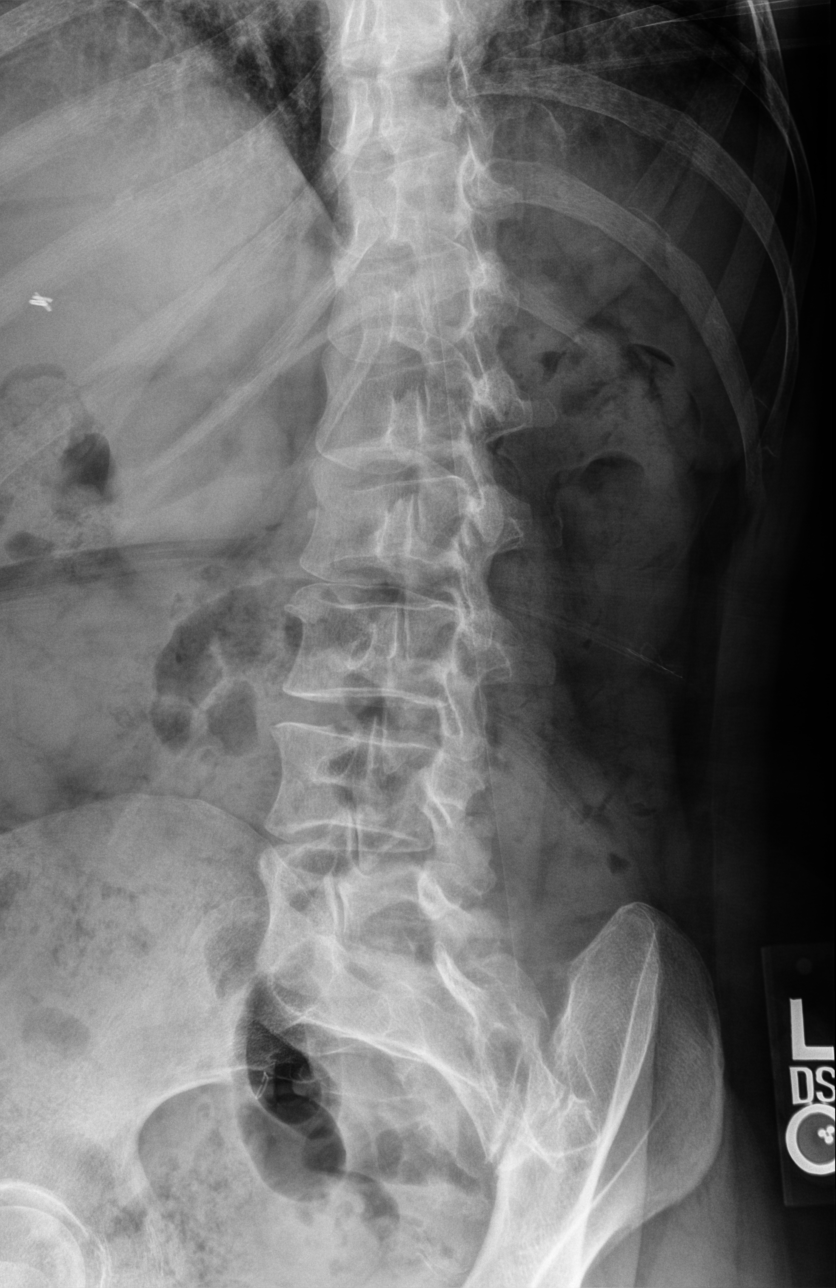

[lpo]
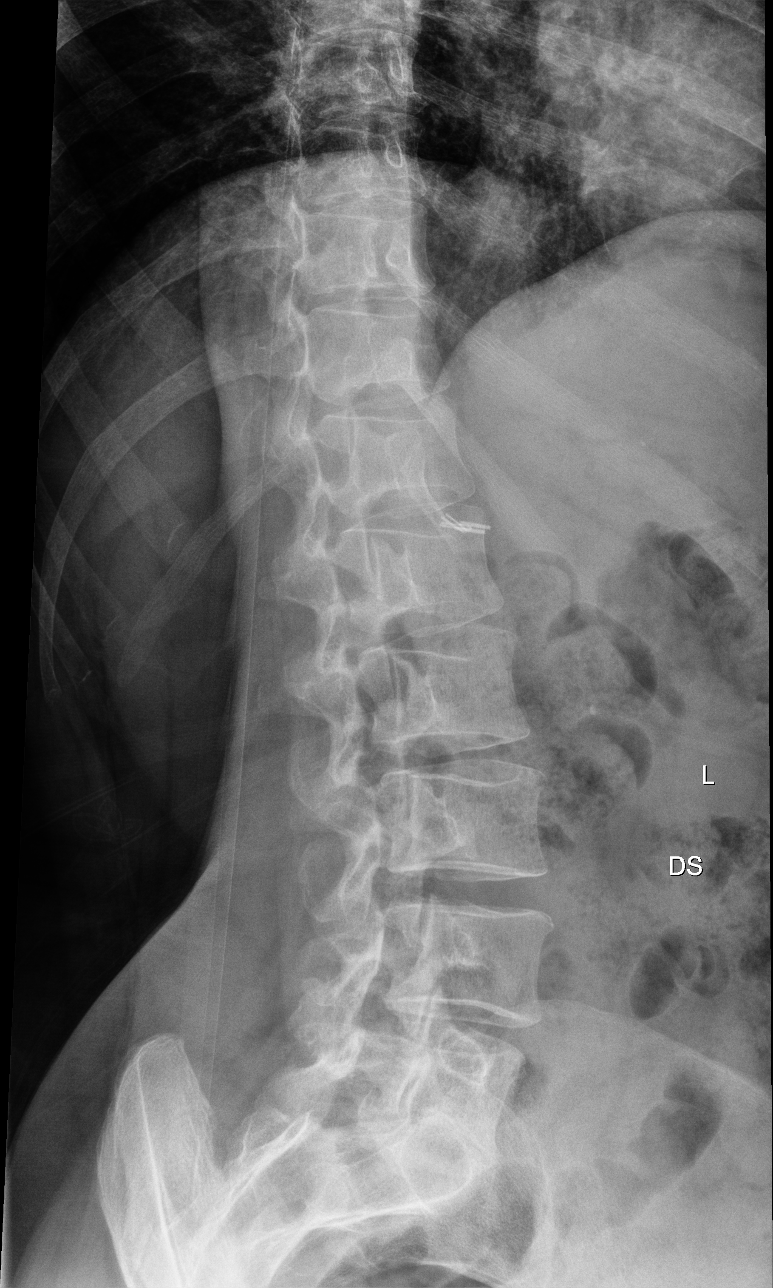

[lateral]
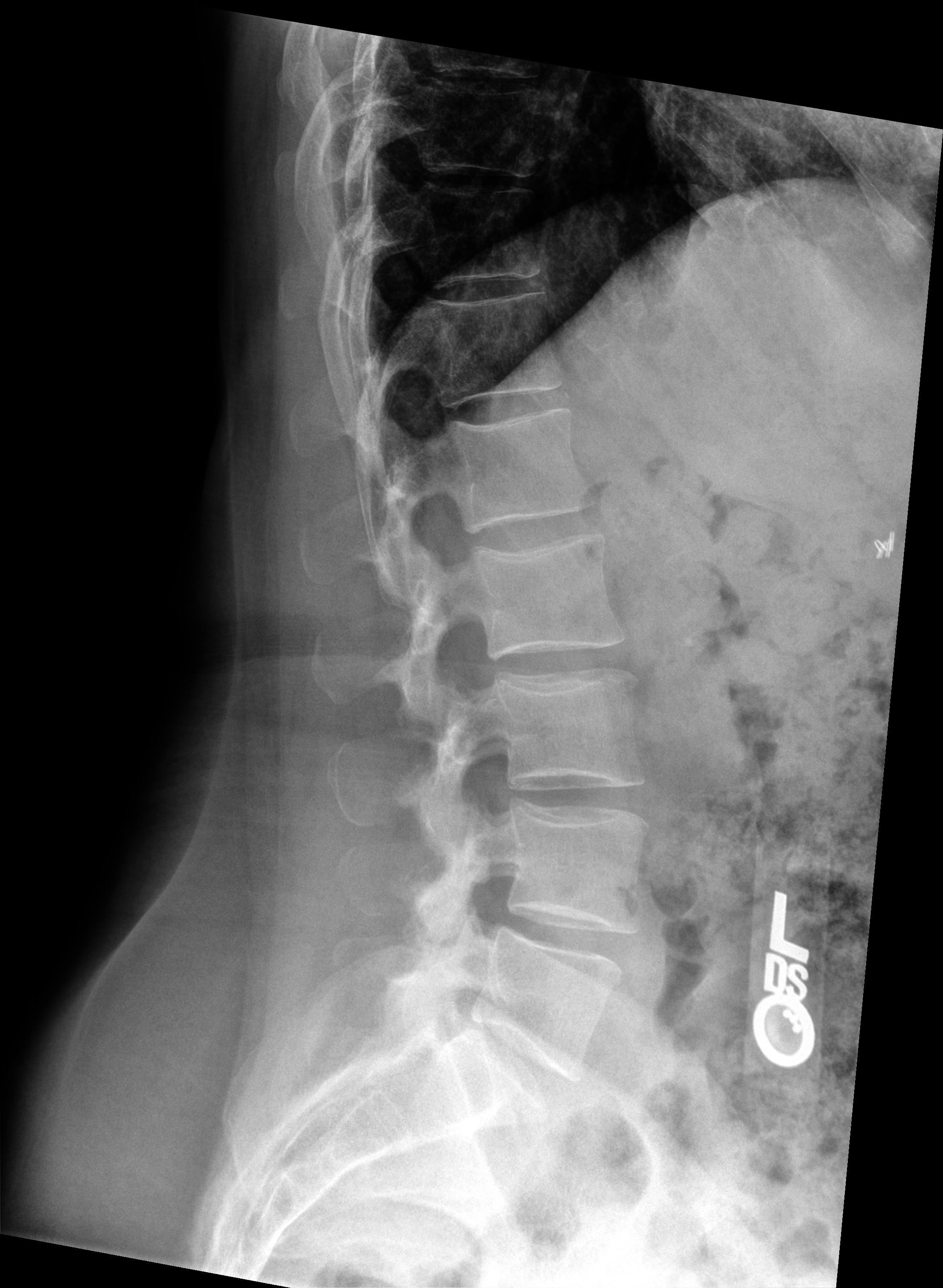

[4 of 4 positions shown; findings below may reference images not displayed]

FINDINGS: No fracture or subluxation. 6 degrees levocurve may be positional. 
Mild L5-S1 disc space narrowing. Tiny marginal osteophytes. Lower 
lumbar/lumbosacral facet arthropathy. Degenerative change of the SI joints. 
Cholecystectomy and right pelvic clips.
IMPRESSION: Mild degenerative change and mild levocurve.

## 2023-05-23 IMAGING — MR MRI LUMBAR SPINE WITHOUT CONTRAST
6 of 8 series · 12 of 48 positions shown · IV contrast (gadolinium)
Comparison: Lumbar spine x-ray from May 07, 2023.

________________________________________________________________________________________________ 
MRI LUMBAR SPINE WITHOUT CONTRAST, 05/23/2023 [DATE]: 
CLINICAL INDICATION: Chronic low back pain.
TECHNIQUE: Multiplanar, multiecho position MR images of the lumbar spine were 
performed without intravenous gadolinium enhancement. Patient was scanned on a 
1.5T magnet

[Series 101: survey · axial · 10.0mm · 1.25mm/px · 1 of 10 slices shown]
[im 1/10]
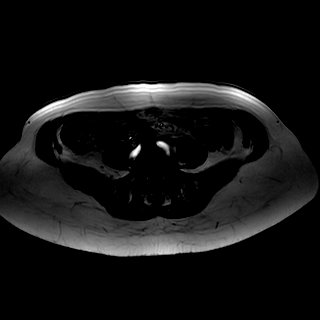

[Series 201: t2w_cor-surv · coronal · 6.0mm · 0.62mm/px · 1 of 10 slices shown]
[im 1/10]
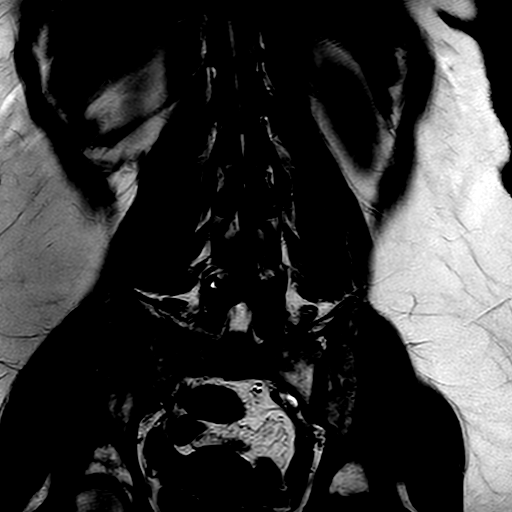

[Series 301: t1_tse_sag · sagittal · 4.0mm · 0.34mm/px · 3 of 19 slices shown]
[im 1/19]
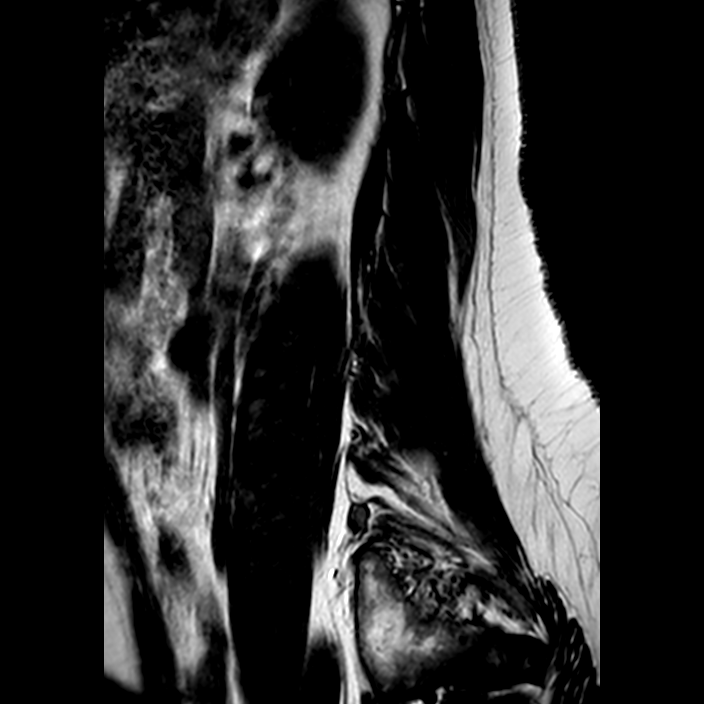
[im 10/19]
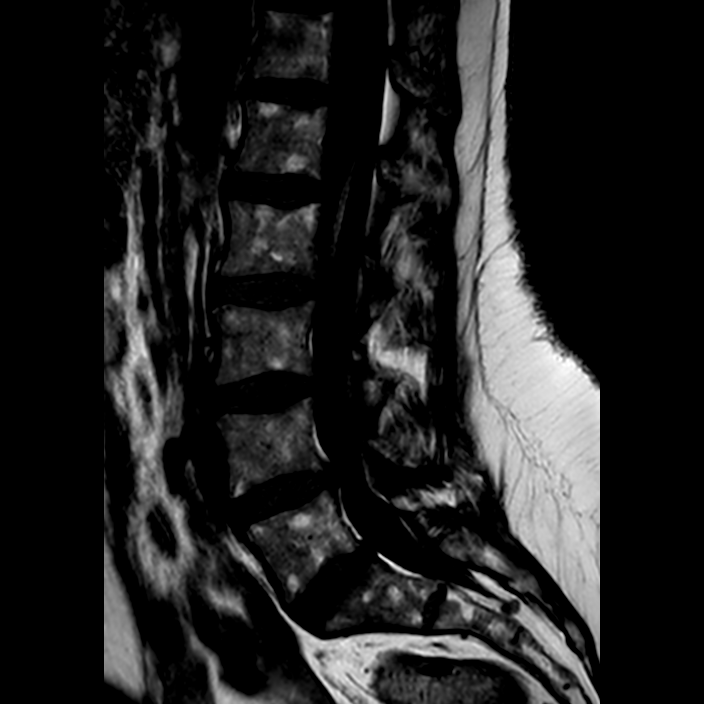
[im 19/19]
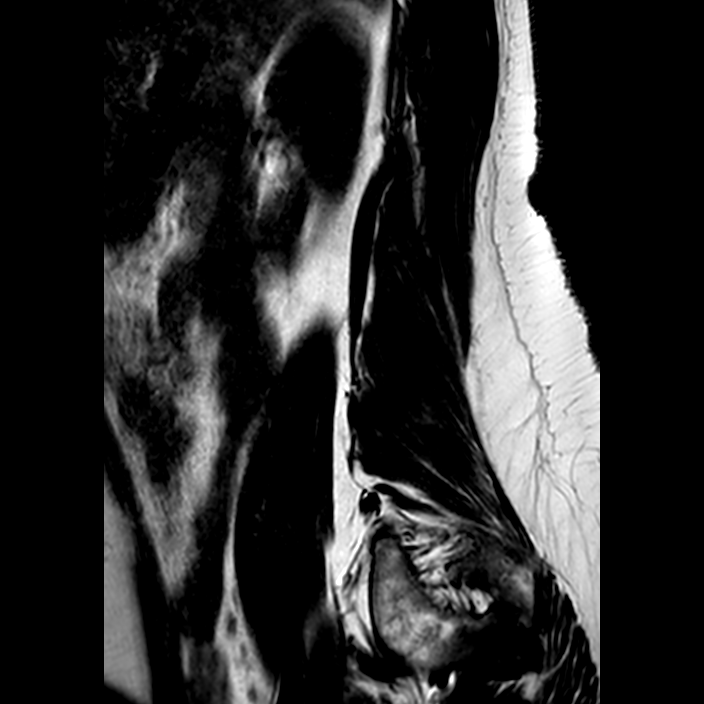

[Series 402: (id)_mdixon_tse · sagittal · 4.0mm · 0.51mm/px · 3 of 19 slices shown]
[im 1/19]
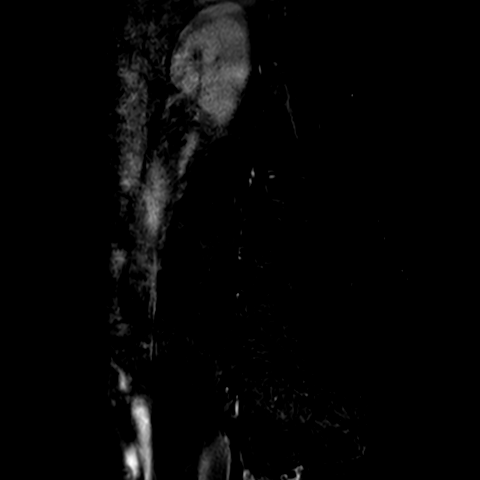
[im 10/19]
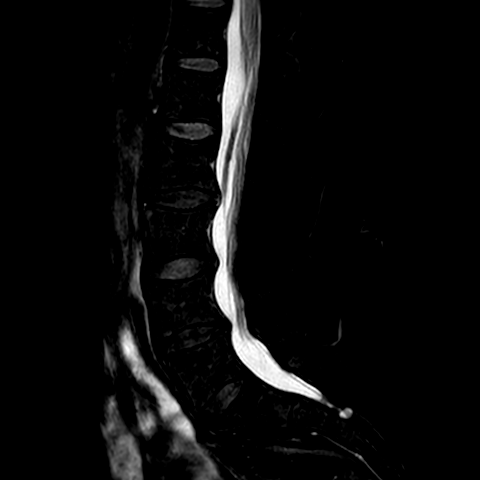
[im 19/19]
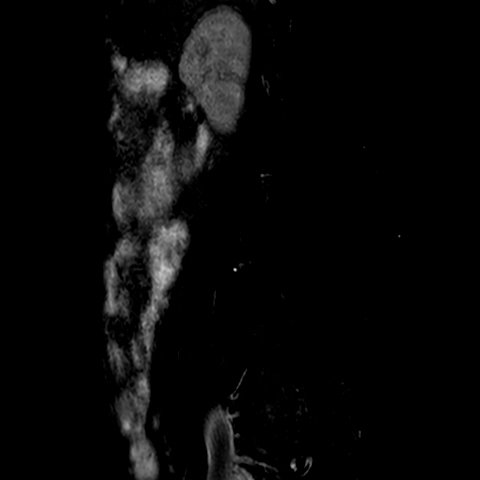

[Series 403: st2w_mdixon_tse · sagittal · 4.0mm · 0.51mm/px · 3 of 19 slices shown]
[im 1/19]
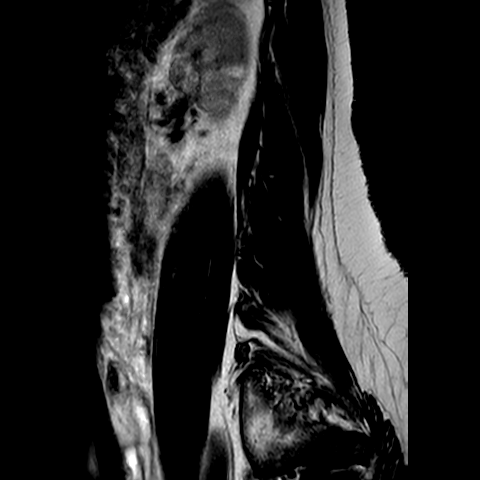
[im 10/19]
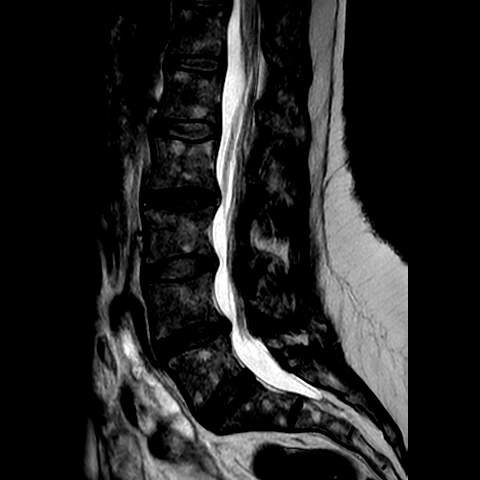
[im 19/19]
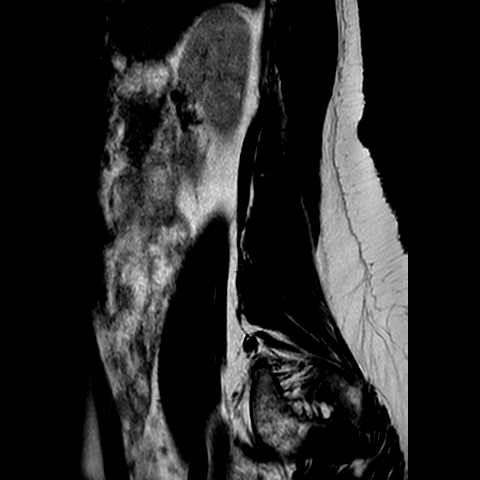

[Series 502: (id) view_ax mpr · axial · 1.0mm · 0.25mm/px · 1 of 119 slices shown]
[im 8/119]
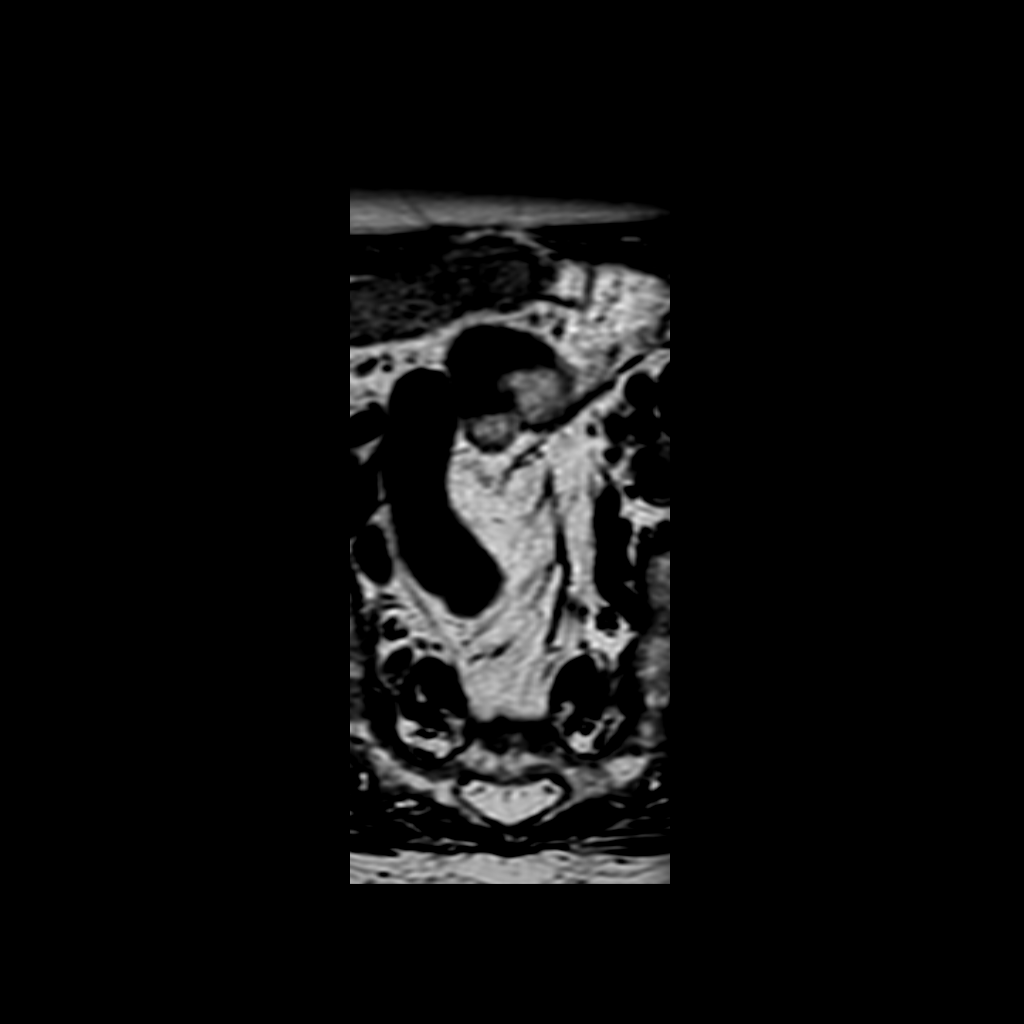

[12 of 48 positions shown; findings below may reference images not displayed]

FINDINGS: -------------------------------------------------------------------------------- 
------ 
GENERAL: 
Nomenclature is based on 5 lumbar type vertebral bodies.     
ALIGNMENT: Leftward curvature, mild, of the lumbar spine. 
VERTEBRAL BODY HEIGHT: Normal.  
MARROW SIGNAL: Heterogeneity of the bone marrow, nonspecific and likely from 
underlying red marrow reconversion. 
CORD SIGNAL: Normal distal spinal cord and cauda equina.  Conus terminates at 
L1. There is fatty infiltration of the filum terminale which extends anteriorly 
in the thecal sac, abutting the posterior margin of the L2-L3 disc. 
ADDITIONAL FINDINGS: None. 
Modic I-II: None. 
Ligamentum Flavum > 2.5 mm: All levels. 
-------------------------------------------------------------------------------- 
------ 
SEGMENTAL: 
T12-L1: No significant central canal narrowing.  No significant right neural 
foraminal narrowing. No significant left neural foraminal narrowing.  
L1-L2: No significant central canal narrowing.  No significant right neural 
foraminal narrowing. No significant left neural foraminal narrowing.  
L2-L3: Disc bulge with posterior annular fissure, this eccentric towards the 
right side; no significant central canal narrowing with mild right subarticular 
recess narrowing.  No significant right neural foraminal narrowing. No 
significant left neural foraminal narrowing.  
L3-L4: Disc bulge with posterior annular fissure; mild central canal narrowing.  
No significant right neural foraminal narrowing. No significant left neural 
foraminal narrowing.  
L4-L5: Disc bulge eccentric to the right with right paracentral to subarticular 
shallow disc herniation, bilateral facet/ligamentum flavum hypertrophy; mild 
central canal narrowing with moderate right subarticular recess narrowing.  Mild 
right neural foraminal narrowing. Mild left neural foraminal narrowing.  
L5-S1: Disc bulge, bilateral facet hypertrophy; no significant central canal 
narrowing.  Mild right neural foraminal narrowing. Moderate left neural 
foraminal narrowing.  
-------------------------------------------------------------------------------- 
------
IMPRESSION: 1.  Discogenic/degenerative changes as above. 
2.  Fatty infiltration of the filum terminale. 
3.  Heterogeneity of bone marrow signal, likely secondary to red marrow 
reconversion, please correlate with CBC. 
4.  Moderate right subarticular recess narrowing at L4-L5. 
5.  Moderate left neural foraminal narrowing at L5-S1.

## 2023-07-02 IMAGING — CT CT CALCIUM SCORING
1 series · 15 of 20 positions shown, 19 images · non-contrast
Comparison: There are no previous exams available for comparison.

________________________________________________________________________________________________ 
CT CALCIUM SCORING, 07/02/2023 [DATE]:
INDICATION: Encounter for screening for cardiovascular disorders

[Series 2: cascoreseq 3.0 b35s 60% · axial · 0.34mm/px · z∈[-291,-140]mm · 15 of 113 slices shown, 19 images]
[im 6/113  vessel]
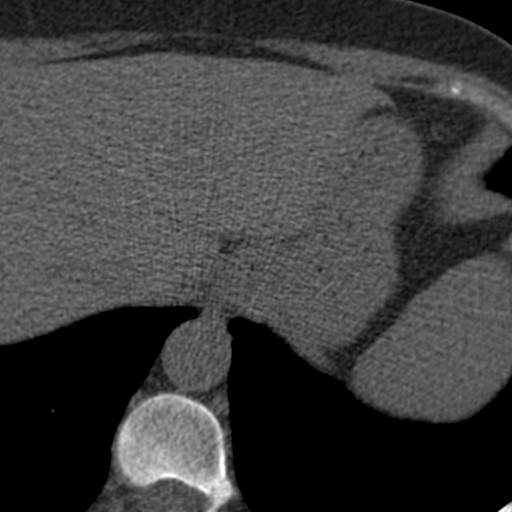
[im 6/113  lung]
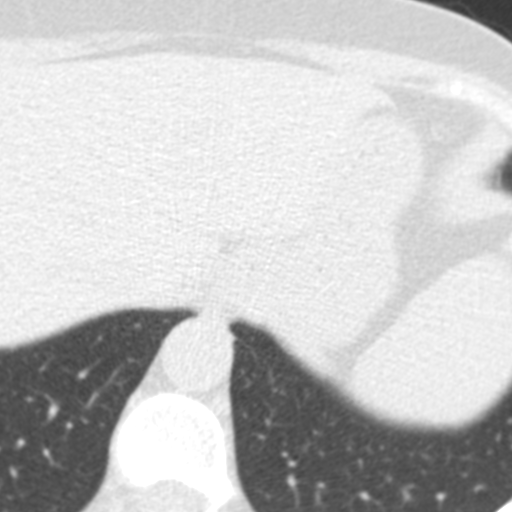
[im 12/113  vessel]
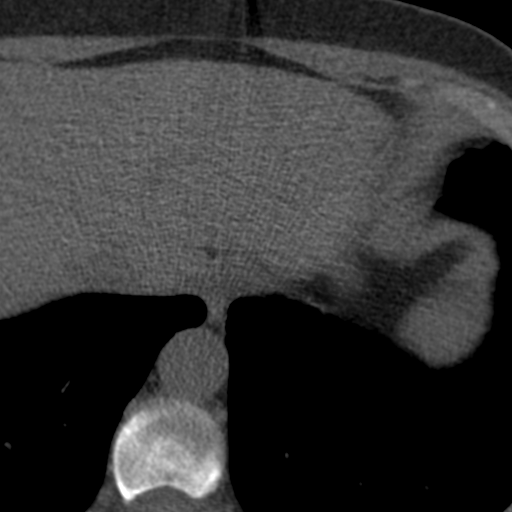
[im 24/113  vessel]
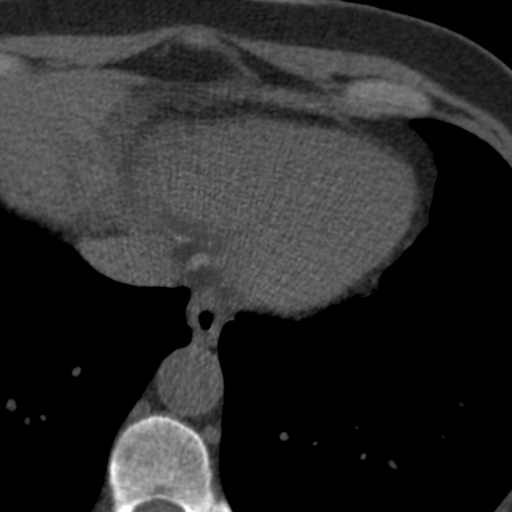
[im 30/113  vessel]
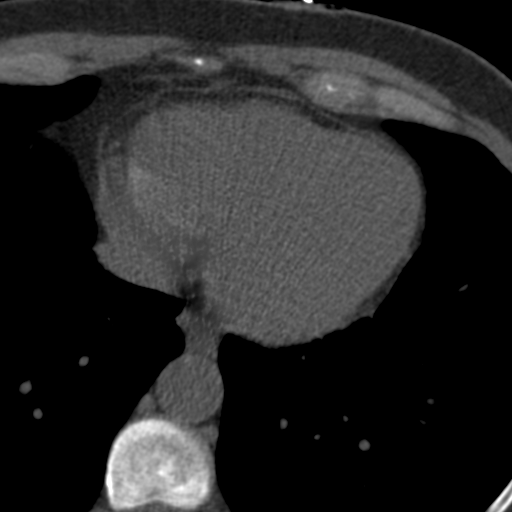
[im 36/113  vessel]
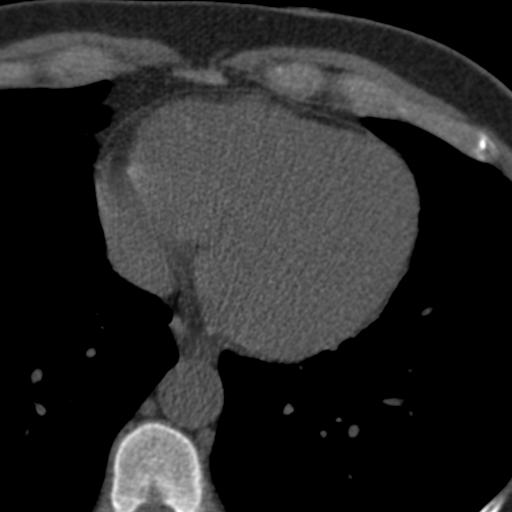
[im 36/113  lung]
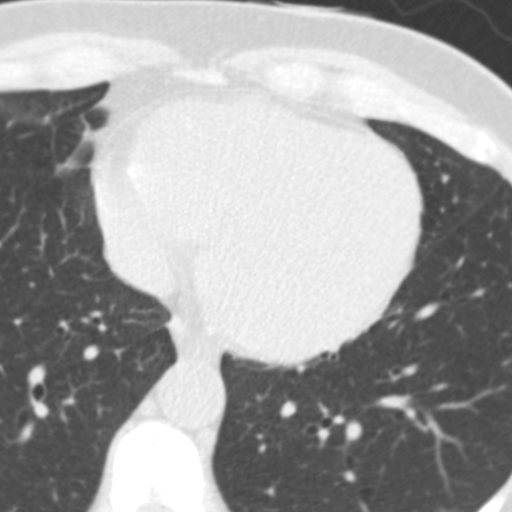
[im 42/113  vessel]
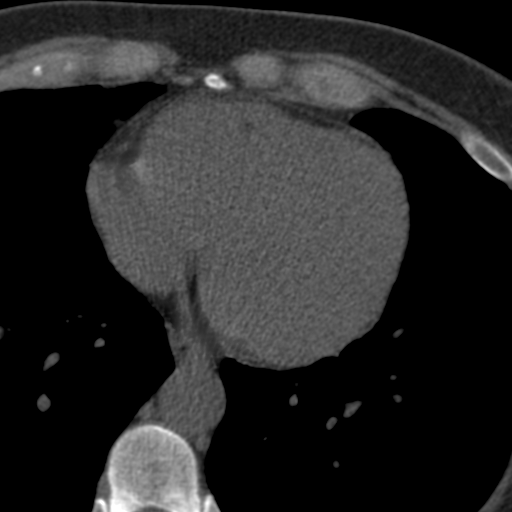
[im 48/113  vessel]
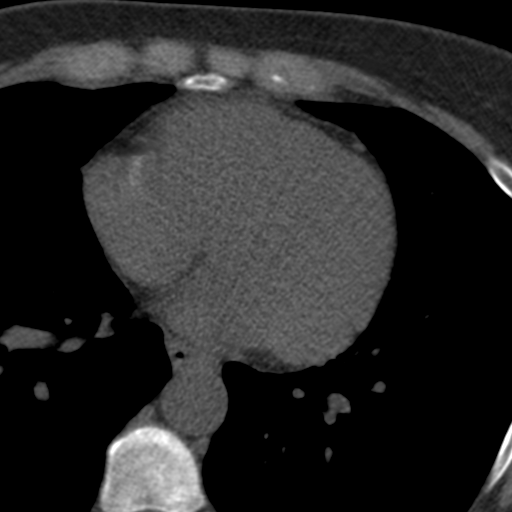
[im 59/113  vessel]
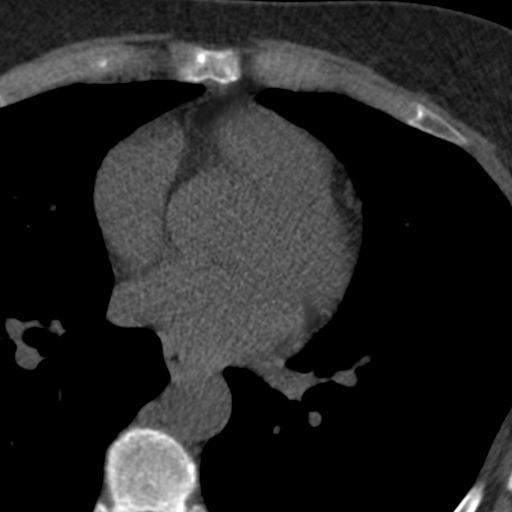
[im 65/113  vessel]
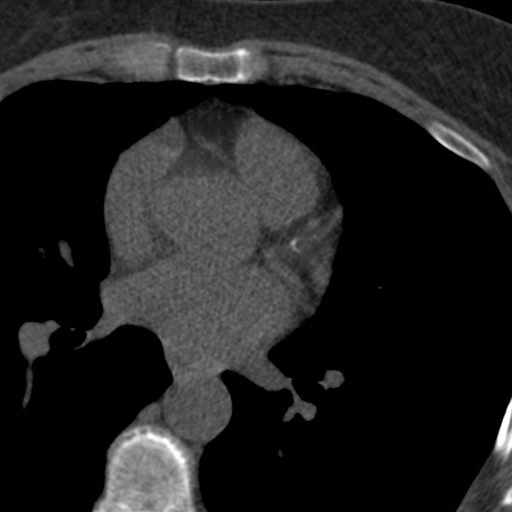
[im 65/113  lung]
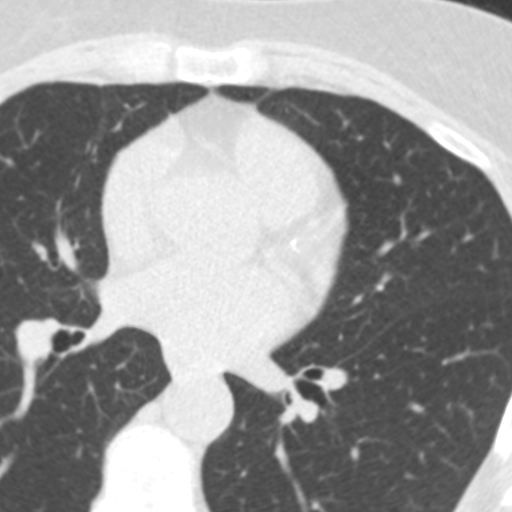
[im 71/113  vessel]
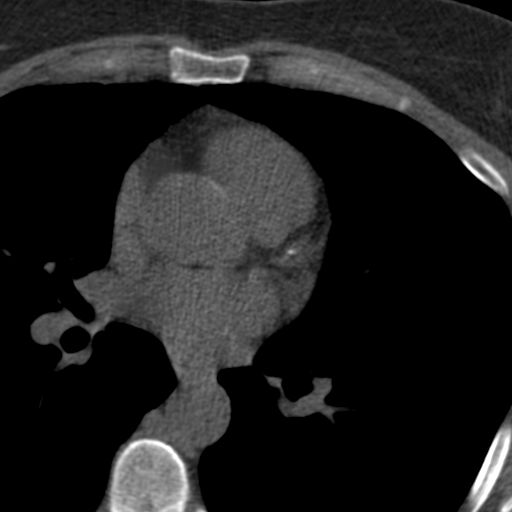
[im 77/113  vessel]
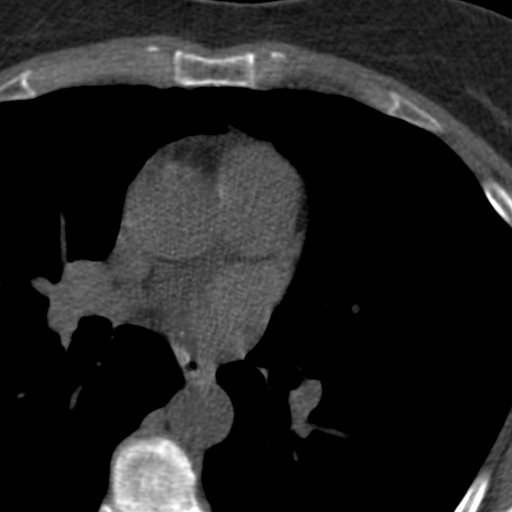
[im 83/113  vessel]
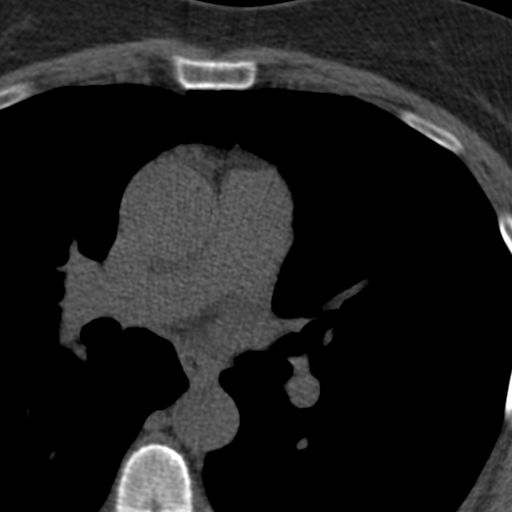
[im 95/113  vessel]
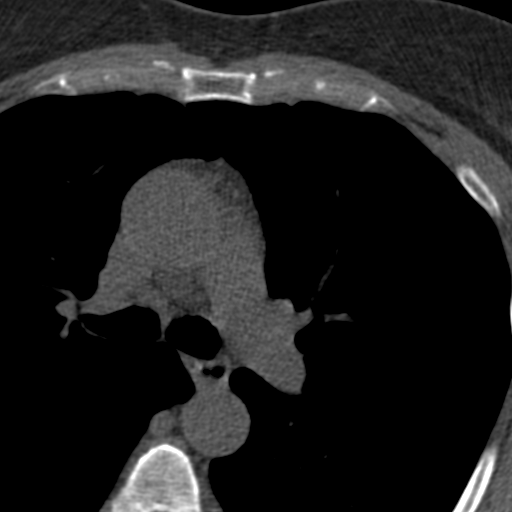
[im 95/113  lung]
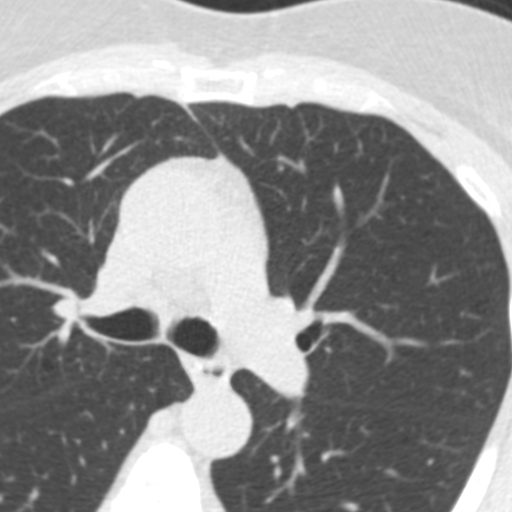
[im 101/113  vessel]
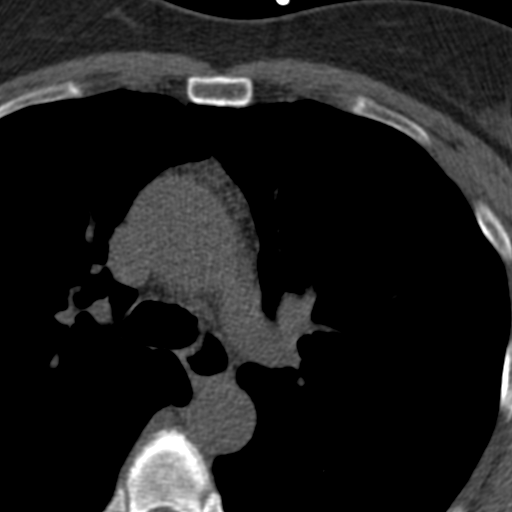
[im 107/113  vessel]
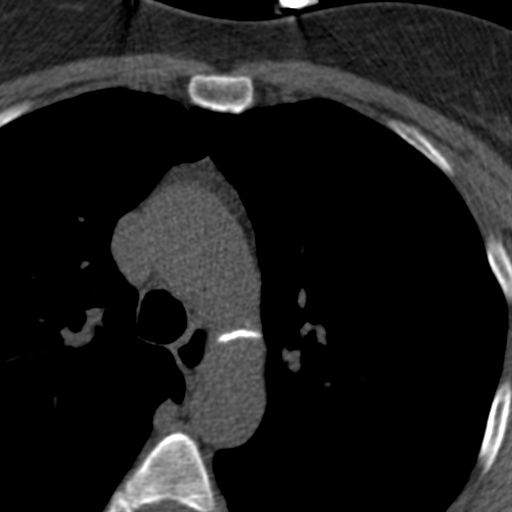

[15 of 20 positions shown; findings below may reference images not displayed]

Count of known CT and Cardiac Nuclear Medicine studies performed in the previous 
12 months = 0
FINDINGS: Visualized lungs are clear.
IMPRESSION: Left Main (LM) Score: 0 
Left Anterior Descending Artery (LAD) Score: 16 
Circumflex (CM) Score: 0 
Right Coronary Artery (RCA) Score: 0 
Posterior Descending Artery (PDA) Score: 0 
Other: 0
IMPRESSION: Total Calcium Score: 16 
No ancillary findings. 
If calcium score is greater than 100 and less than 1111 would recommend 
follow-up with coronary CTA with plaque analysis. 
Calcium scoring sheets to follow. 
RADIATION DOSE REDUCTION: All CT scans are performed using radiation dose 
reduction techniques, when applicable.  Technical factors are evaluated and 
adjusted to ensure appropriate moderation of exposure.  Automated dose 
management technology is applied to adjust the radiation doses to minimize 
exposure while achieving diagnostic quality images. 
Consider further evaluation if multi-vessel or left-main predominant disease is 
present. 
Calcium score                                     Presence of Plaque 
0                                                    No evidence of plaque 
1-10                                               Minimal evidence of plaque 
11-100                                            Mild evidence of plaque 
101-400                                          Moderate evidence of plaque 
Over 400                                        Extensive evidence of plaque

## 2023-07-02 IMAGING — MG MAMMOGRAPHY SCREENING BILATERAL 3[PERSON_NAME]
8 series · 9 of 24 positions shown · non-contrast
Comparison: Comparison was made to prior examinations.

________________________________________________________________________________________________ 
MAMMOGRAPHY SCREENING BILATERAL 3[facility], 07/02/2023 [DATE]: 
CLINICAL INDICATION: Encounter for screening mammogram.
TECHNIQUE: Digital bilateral mammograms and 3-D Tomosynthesis were obtained. 
These were interpreted both primarily and with the aid of computer-aided 
detection system.  
BREAST DENSITY: (Level B) There are scattered areas of fibroglandular density.

[R MLO]
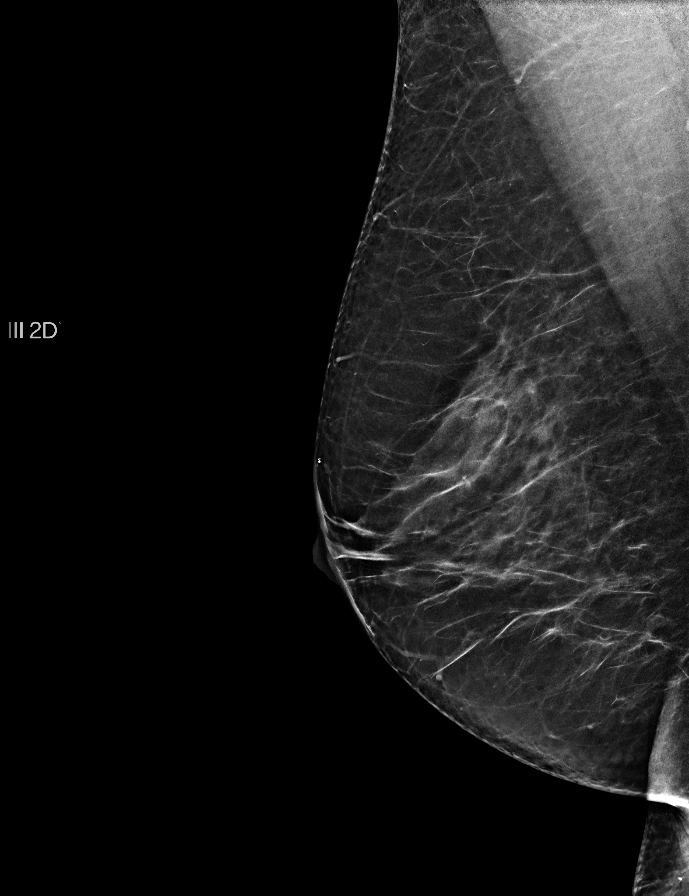

[R CC]
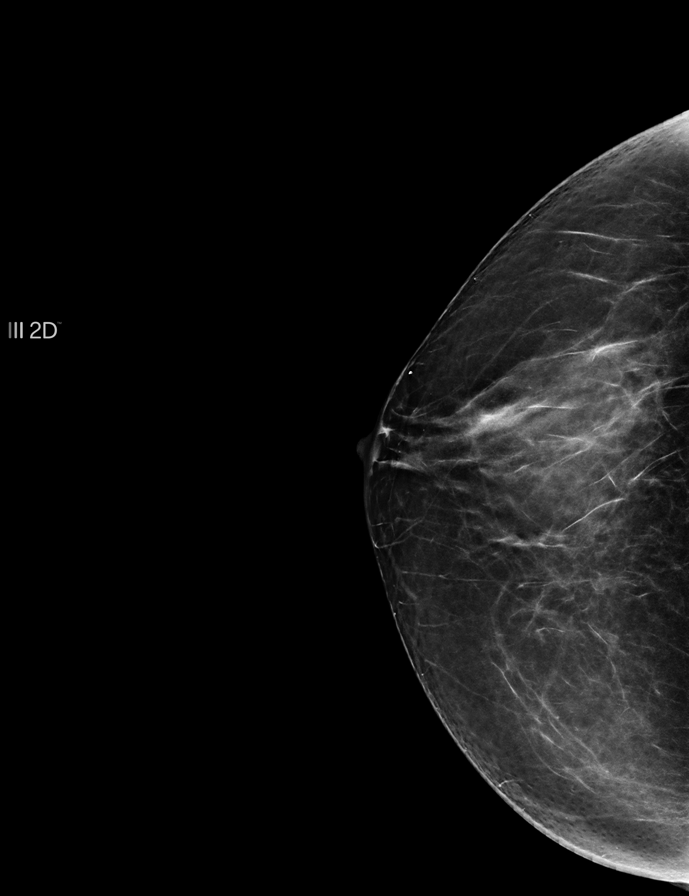

[L MLO]
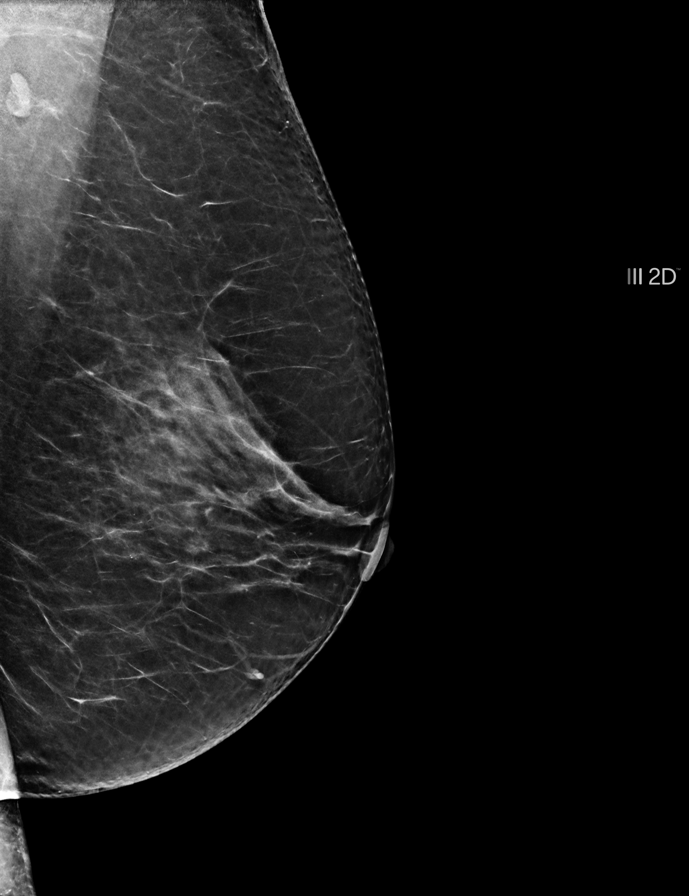

[L CC]
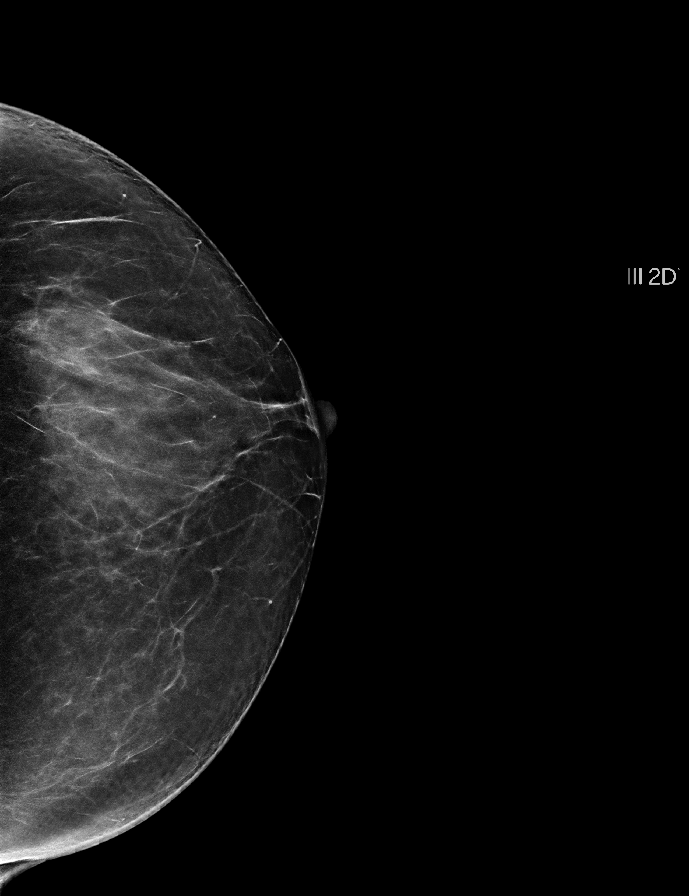

[R CC tomo · 2 of 21 frames shown]
[frame 7/21]
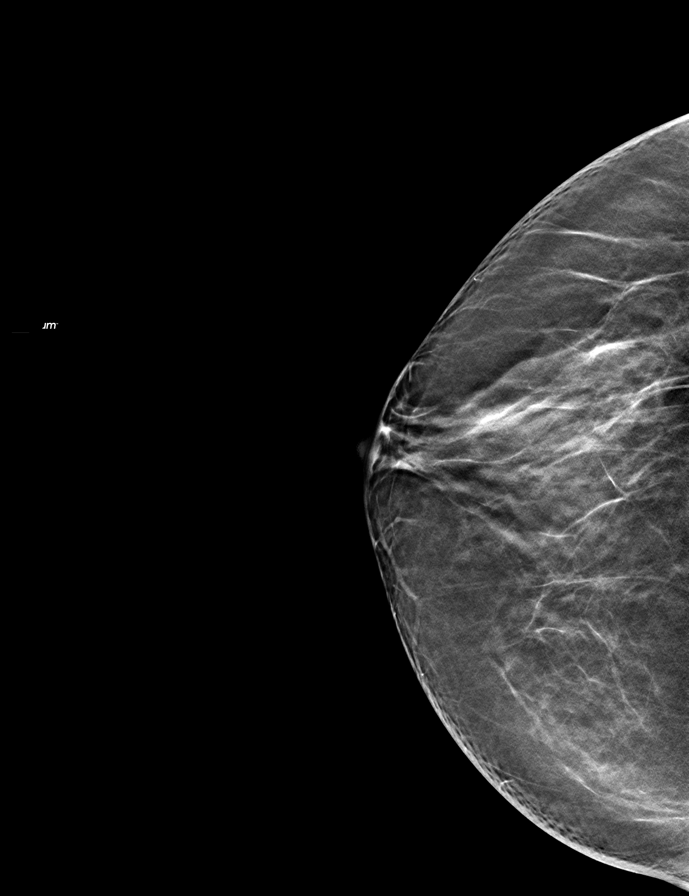
[frame 11/21]
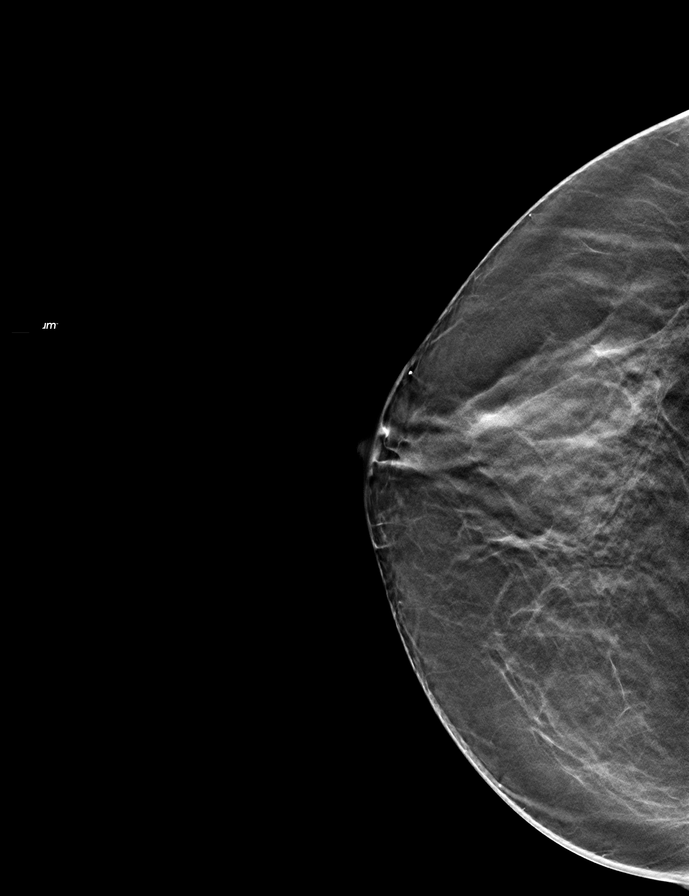

[L CC tomo · tomo slice 11/21.0]
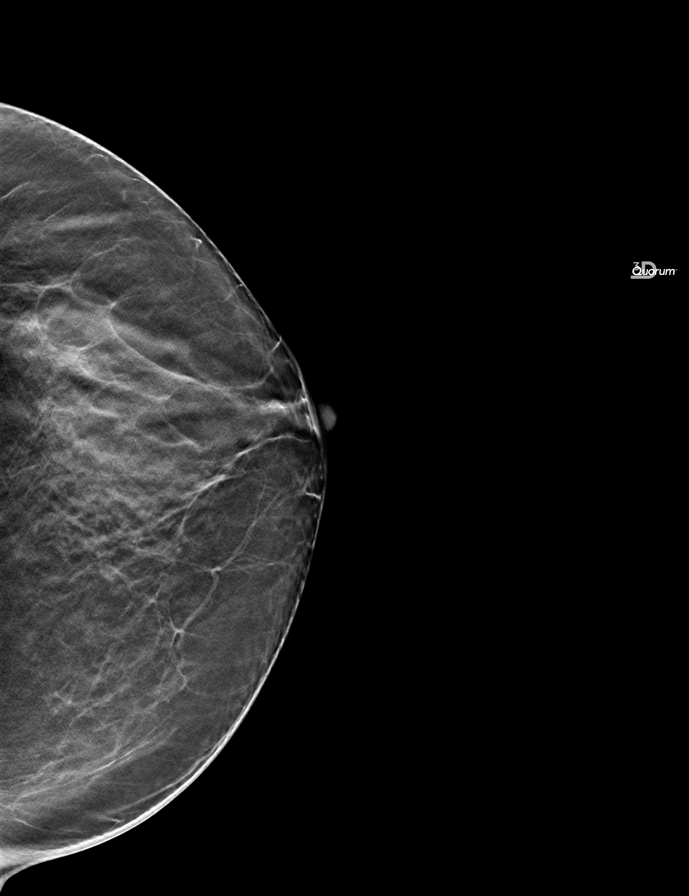

[L MLO tomo · tomo slice 11/21.0]
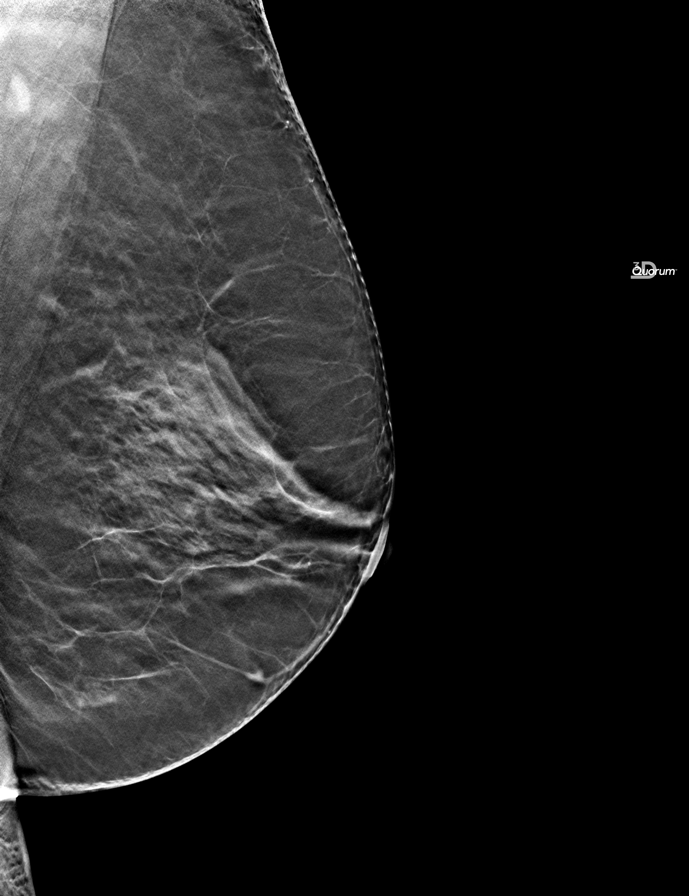

[R MLO tomo · tomo slice 11/22.0]
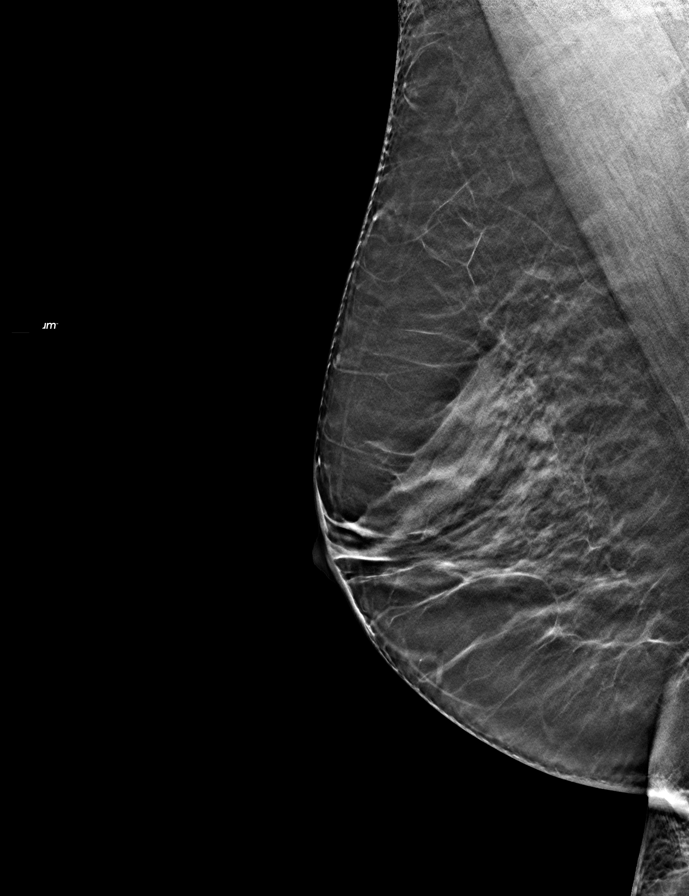

[9 of 24 positions shown; findings below may reference images not displayed]

FINDINGS: No suspicious mass, calcifications, or area of architectural 
distortion in either breast.
IMPRESSION: Stable exam. 
(BI-RADS 2) Benign findings. Routine mammographic follow-up is recommended.
